# Patient Record
Sex: Male | Born: 2000
Health system: Southern US, Community
[De-identification: ages and names within clinical notes are randomized; demographics above are authoritative.]

## PROBLEM LIST (undated history)

## (undated) DIAGNOSIS — F819 Developmental disorder of scholastic skills, unspecified: Secondary | ICD-10-CM

## (undated) DIAGNOSIS — R6250 Unspecified lack of expected normal physiological development in childhood: Secondary | ICD-10-CM

## (undated) DIAGNOSIS — F840 Autistic disorder: Secondary | ICD-10-CM

## (undated) HISTORY — DX: Unspecified lack of expected normal physiological development in childhood: R62.50

## (undated) HISTORY — PX: CIRCUMCISION: SUR203

## (undated) HISTORY — PX: UPPER GASTROINTESTINAL ENDOSCOPY: SHX188

## (undated) HISTORY — DX: Developmental disorder of scholastic skills, unspecified: F81.9

## (undated) HISTORY — PX: COLONOSCOPY: SHX174

## (undated) HISTORY — DX: Autistic disorder: F84.0

## (undated) HISTORY — PX: DENTAL SURGERY: SHX609

---
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
## 2021-01-30 ENCOUNTER — Telehealth: Payer: Self-pay | Admitting: Family Medicine

## 2021-01-30 NOTE — Telephone Encounter (Signed)
Patient's mom dropped off form to be completed in your folder.

## 2021-02-09 NOTE — Telephone Encounter (Signed)
I filled out the form to the best I know possible.  It is now ready thank you if family needs something additional let me know thank you

## 2021-02-10 DIAGNOSIS — Z029 Encounter for administrative examinations, unspecified: Secondary | ICD-10-CM

## 2021-06-06 ENCOUNTER — Other Ambulatory Visit (INDEPENDENT_AMBULATORY_CARE_PROVIDER_SITE_OTHER): Payer: Self-pay | Admitting: Family

## 2021-06-06 DIAGNOSIS — G4700 Insomnia, unspecified: Secondary | ICD-10-CM

## 2021-07-14 ENCOUNTER — Encounter: Payer: Self-pay | Admitting: Family Medicine

## 2021-07-14 ENCOUNTER — Ambulatory Visit (INDEPENDENT_AMBULATORY_CARE_PROVIDER_SITE_OTHER): Payer: 59 | Admitting: Family Medicine

## 2021-07-14 VITALS — BP 118/72 | HR 63 | Temp 98.1°F | Wt 141.6 lb

## 2021-07-14 DIAGNOSIS — J029 Acute pharyngitis, unspecified: Secondary | ICD-10-CM | POA: Diagnosis not present

## 2021-07-14 LAB — POCT RAPID STREP A (OFFICE): Rapid Strep A Screen: POSITIVE — AB

## 2021-07-14 MED ORDER — AZITHROMYCIN 250 MG PO TABS: ORAL_TABLET | ORAL | 0 refills | Status: AC

## 2021-07-14 NOTE — Progress Notes (Signed)
? ?  Subjective:  ? ? Patient ID: Jon Adams, male    DOB: 01/17/2001, 21 y.o.   MRN: [id number] ? ?HPI ?Pt having sore throat. Red mostly on right side. Pt nephew has been diagnosed with strep. Symptoms began on Sunday.  ?Ongoing sore throat hurts to swallow denies coughing wheezing difficulty breathing had family members who had strep ? ?Review of Systems ? ?   ?Objective:  ? Physical Exam ? ?Gen-NAD not toxic ?TMS-normal bilateral ?T- normal mild redness ?Chest-CTA respiratory rate normal no crackles ?CV RRR no murmur ?Skin-warm dry ?Neuro-grossly normal ?Throat erythematous mainly on the right side ?Exudate noted ? ?   ?Assessment & Plan:  ?Go ahead with treatment with azithromycin to treat for strep ?Await findings of the culture ?Supportive measures discussed ? ?Rapid strep test came back positive ?Should respond to azithromycin ?Follow-up if any problems ?

## 2021-07-17 LAB — CULTURE, GROUP A STREP: Strep A Culture: NEGATIVE

## 2021-07-19 ENCOUNTER — Telehealth: Payer: 59 | Admitting: Physician Assistant

## 2021-07-19 DIAGNOSIS — J020 Streptococcal pharyngitis: Secondary | ICD-10-CM

## 2021-07-19 MED ORDER — AMOXICILLIN-POT CLAVULANATE 400-57 MG/5ML PO SUSR: 875.0000 mg | Freq: Two times a day (BID) | ORAL | 0 refills | Status: AC

## 2021-07-19 NOTE — Progress Notes (Signed)
Virtual Visit Consent   [employer], you are scheduled for a virtual visit with a [facility] provider today. Just as with appointments in the office, your consent must be obtained to participate. Your consent will be active for this visit and any virtual visit you may have with one of our providers in the next 365 days. If you have a MyChart account, a copy of this consent can be sent to you electronically.  As this is a virtual visit, video technology does not allow for your provider to perform a traditional examination. This may limit your provider's ability to fully assess your condition. If your provider identifies any concerns that need to be evaluated in person or the need to arrange testing (such as labs, EKG, etc.), we will make arrangements to do so. Although advances in technology are sophisticated, we cannot ensure that it will always work on either your end or our end. If the connection with a video visit is poor, the visit may have to be switched to a telephone visit. With either a video or telephone visit, we are not always able to ensure that we have a secure connection.  By engaging in this virtual visit, you consent to the provision of healthcare and authorize for your insurance to be billed (if applicable) for the services provided during this visit. Depending on your insurance coverage, you may receive a charge related to this service.  I need to obtain your verbal consent now. Are you willing to proceed with your visit today? [employer] has provided verbal consent on 07/19/2021 for a virtual visit (video or telephone). Inda Coke, [place]  Date: 07/19/2021 12:40 PM  Virtual Visit via Video Note   I, Inda Coke, connected with  Jon Adams  ([id number], [birth year]) on 07/19/21 at  1:00 PM EDT by a video-enabled telemedicine application and verified that I am speaking with the correct person using two identifiers.  Location: Patient: Virtual Visit Location Patient:  Home Provider: Virtual Visit Location Provider: Home Office   I discussed the limitations of evaluation and management by telemedicine and the availability of in person appointments. The patient expressed understanding and agreed to proceed.    History of Present Illness: Jon Adams is a 21 y.o. who identifies as a male who was assigned male at birth, and is being seen today for sore throat. Mother, [name], is on the call with patient today.  Patient was seen by his PCP on 07/14/21 for sore throat. His strep test was positive and he was given oral azithromycin. His nephew recently had strep and exposed him. He has been taking abx as prescribed, however he is having ongoing pain. While he initially had pain on the R side of throat, he now has pain b/l. Also now has cough with green mucus. Mom is also coughing now and on an abx.  He is taking 2 x 200 mg ibuprofen q 6 hours, dimetapp cold and allergy q 4-6 hours.  Denies: SOB, hx of asthma, chest pain, fevers, chills, inability to swallow   Problems:  Patient Active Problem List   Diagnosis Date Noted   Heartburn 01/29/2016   Low weight 10/22/2014   Dysphagia, oral phase 08/27/2014   Anxiety 08/27/2014   Picky eater 03/28/2014   Insomnia 10/24/2013   Laxity of ligament 10/24/2013   Atopic rhinitis 05/31/2012   Autistic disorder 05/31/2012    Allergies:  Allergies  Allergen Reactions   Other     Cat dander-  eyes watery and swollen; runny nose   Medications:  Current Outpatient Medications:    azithromycin (ZITHROMAX) 250 MG tablet, Take 2 tablets on day 1, then 1 tablet daily on days 2 through 5, Disp: 6 tablet, Rfl: 0   cloNIDine (CATAPRES) 0.1 MG tablet, TAKE (1) TABLET BY MOUTH AT BEDTIME., Disp: 30 tablet, Rfl: 5   polyethylene glycol (MIRALAX / GLYCOLAX) packet, Take 17 g by mouth as needed. , Disp: , Rfl:   Observations/Objective: Patient is well-developed, well-nourished in no acute distress.  Resting comfortably  at  home.  Head is normocephalic, atraumatic.  No labored breathing.  Speech is clear and coherent with logical content.  Patient is alert and oriented at baseline.   Assessment and Plan: 1. Strep pharyngitis Ongoing No improvement of sx and now with cough Will treat for possible sinusitis with oral augmentin -- patient prefers liquid Recommend OTC probiotic Push fluids Continue ibuprofen q 6 hours as already doing If worsening or lack of improvement, recommend in office evaluation  Follow Up Instructions: I discussed the assessment and treatment plan with the patient. The patient was provided an opportunity to ask questions and all were answered. The patient agreed with the plan and demonstrated an understanding of the instructions.  A copy of instructions were sent to the patient via MyChart unless otherwise noted below.   The patient was advised to call back or seek an in-person evaluation if the symptoms worsen or if the condition fails to improve as anticipated.  Time:  I spent 15 minutes with the patient via telehealth technology discussing the above problems/concerns.    Inda Coke, [place]

## 2021-08-01 ENCOUNTER — Ambulatory Visit (HOSPITAL_COMMUNITY)
Admission: RE | Admit: 2021-08-01 | Discharge: 2021-08-01 | Disposition: A | Discharge status: Home / Self Care | Payer: 59 | Source: Ambulatory Visit | Attending: Family Medicine | Admitting: Family Medicine

## 2021-08-01 ENCOUNTER — Encounter: Payer: Self-pay | Admitting: Family Medicine

## 2021-08-01 ENCOUNTER — Ambulatory Visit (INDEPENDENT_AMBULATORY_CARE_PROVIDER_SITE_OTHER): Payer: 59 | Admitting: Family Medicine

## 2021-08-01 VITALS — BP 126/80 | HR 75 | Temp 98.3°F | Wt 144.6 lb

## 2021-08-01 DIAGNOSIS — R0781 Pleurodynia: Secondary | ICD-10-CM | POA: Diagnosis not present

## 2021-08-01 DIAGNOSIS — R059 Cough, unspecified: Secondary | ICD-10-CM

## 2021-08-01 MED ORDER — MELOXICAM 15 MG PO TABS: 15.0000 mg | ORAL_TABLET | Freq: Every day | ORAL | 0 refills | Status: DC | PRN

## 2021-08-01 NOTE — Assessment & Plan Note (Signed)
Chest xray obtained today and independently reviewed by me. Interpretation: Normal xray. No evidence of infiltrate.  Likely MSK in nature. Mobic as prescribed.

## 2021-08-01 NOTE — Patient Instructions (Signed)
Chest x-ray today.  We will call with the results.  Medication as directed.  If you fail to improve or worsen, please let me know.

## 2021-08-01 NOTE — Progress Notes (Signed)
Subjective:  Patient ID: Jon Adams, male    DOB: 05-20-2000  Age: 21 y.o. MRN: [id number]  CC: Chief Complaint  Patient presents with   left side pain    Left side pain since last week. Began on Wednesday the 24th. Area is tender. Radiates toward abdomen. Pt has tried Advil. No n/v. Pt also did teleheath for cough and was prescribed Augmentin and then the pain began   HPI:  21 year old male presents for evaluation of the above.  He reports left lower rib pain since last week (5/24). Preceded by strep. Was treated and subsequently developed cough. Pain is intermittent. Worse with certain movements. No chest pain, SOB. Does still have a cough. No fever.   Patient Active Problem List   Diagnosis Date Noted   Rib pain on left side 08/01/2021   Heartburn 01/29/2016   Anxiety 08/27/2014   Insomnia 10/24/2013   Atopic rhinitis 05/31/2012   Autistic disorder 05/31/2012    Social Hx   Social History   Socioeconomic History   Marital status: Single    Spouse name: Not on file   Number of children: Not on file   Years of education: Not on file   Highest education level: Not on file  Occupational History   Not on file  Tobacco Use   Smoking status: Never   Smokeless tobacco: Never  Substance and Sexual Activity   Alcohol use: No    Alcohol/week: 0.0 standard drinks   Drug use: No   Sexual activity: Never  Other Topics Concern   Not on file  Social History Narrative   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    Jon Adams enjoys Chief Technology Officer, old money, computers, and playing on his phone.    He lives with his parents and siblings.   Social Determinants of Health   Financial Resource Strain: Not on file  Food Insecurity: Not on file  Transportation Needs: Not on file  Physical Activity: Not on file  Stress: Not on file  Social Connections: Not on file    Review of Systems Per HPI  Objective:  BP 126/80   Pulse 75   Temp 98.3 F (36.8 C)   Wt 144 lb 9.6 oz (65.6 kg)    SpO2 96%   BMI 21.05 kg/m      08/01/2021    1:30 PM 07/14/2021   11:35 AM 11/07/2020   11:09 AM  BP/Weight  Systolic BP 341 937 902  Diastolic BP 80 72 56  Wt. (Lbs) 144.6 141.6 149  BMI 21.05 kg/m2 20.61 kg/m2 21.69 kg/m2    Physical Exam Vitals and nursing note reviewed.  Constitutional:      General: He is not in acute distress.    Appearance: Normal appearance. He is not ill-appearing.  HENT:     Head: Normocephalic and atraumatic.  Cardiovascular:     Rate and Rhythm: Normal rate and regular rhythm.  Pulmonary:     Effort: Pulmonary effort is normal.     Breath sounds: Normal breath sounds. No wheezing, rhonchi or rales.  Abdominal:     General: There is no distension.     Palpations: Abdomen is soft.     Tenderness: There is no abdominal tenderness.  Neurological:     Mental Status: He is alert.    Lab Results  Component Value Date   WBC 4.7 05/15/2014   HGB 16.2 05/15/2014   HCT 47.5 05/15/2014   PLT 266 05/15/2014  GLUCOSE 94 05/15/2014   ALT 7 06/08/2014   AST 13 06/08/2014   NA 143 05/15/2014   K 4.7 05/15/2014   CL 101 05/15/2014   CREATININE 0.90 05/15/2014   BUN 7 05/15/2014   CO2 24 05/15/2014   TSH 1.890 06/08/2014     Assessment & Plan:   Problem List Items Addressed This Visit       Other   Rib pain on left side - Primary    Chest xray obtained today and independently reviewed by me. Interpretation: Normal xray. No evidence of infiltrate.  Likely MSK in nature. Mobic as prescribed.        Relevant Orders   DG Chest 2 View (Completed)   Other Visit Diagnoses     Cough, unspecified type       Relevant Orders   DG Chest 2 View (Completed)       Meds ordered this encounter  Medications   meloxicam (MOBIC) 15 MG tablet    Sig: Take 1 tablet (15 mg total) by mouth daily as needed for pain.    Dispense:  30 tablet    Refill:  [facility]

## 2021-08-15 ENCOUNTER — Telehealth: Payer: 59 | Admitting: Physician Assistant

## 2021-08-15 DIAGNOSIS — B9689 Other specified bacterial agents as the cause of diseases classified elsewhere: Secondary | ICD-10-CM | POA: Diagnosis not present

## 2021-08-15 DIAGNOSIS — J019 Acute sinusitis, unspecified: Secondary | ICD-10-CM | POA: Diagnosis not present

## 2021-08-15 MED ORDER — AMOXICILLIN-POT CLAVULANATE 400-57 MG/5ML PO SUSR: 10.0000 mL | Freq: Two times a day (BID) | ORAL | 0 refills | Status: DC

## 2021-08-15 NOTE — Progress Notes (Signed)
E-Visit for Sinus Problems  We are sorry that you are not feeling well.  Here is how we plan to help!  Based on what you have shared with me it looks like you have sinusitis.  Sinusitis is inflammation and infection in the sinus cavities of the head.  Based on your presentation I believe you most likely have Acute Bacterial Sinusitis.  This is an infection caused by bacteria and is treated with antibiotics. I have prescribed Augmentin '875mg'$ /'125mg'$  one tablet twice daily with food, for 7 days. Liquid medication prescribed. You may use an oral decongestant such as Mucinex D or if you have glaucoma or high blood pressure use plain Mucinex. Saline nasal spray help and can safely be used as often as needed for congestion.  If you develop worsening sinus pain, fever or notice severe headache and vision changes, or if symptoms are not better after completion of antibiotic, please schedule an appointment with a health care provider.    Sinus infections are not as easily transmitted as other respiratory infection, however we still recommend that you avoid close contact with loved ones, especially the very young and elderly.  Remember to wash your hands thoroughly throughout the day as this is the number one way to prevent the spread of infection!  Home Care: Only take medications as instructed by your medical team. Complete the entire course of an antibiotic. Do not take these medications with alcohol. A steam or ultrasonic humidifier can help congestion.  You can place a towel over your head and breathe in the steam from hot water coming from a faucet. Avoid close contacts especially the very young and the elderly. Cover your mouth when you cough or sneeze. Always remember to wash your hands.  Get Help Right Away If: You develop worsening fever or sinus pain. You develop a severe head ache or visual changes. Your symptoms persist after you have completed your treatment plan.  Make sure you Understand  these instructions. Will watch your condition. Will get help right away if you are not doing well or get worse.  Thank you for choosing an e-visit.  Your e-visit answers were reviewed by a board certified advanced clinical practitioner to complete your personal care plan. Depending upon the condition, your plan could have included both over the counter or prescription medications.  Please review your pharmacy choice. Make sure the pharmacy is open so you can pick up prescription now. If there is a problem, you may contact your provider through [employer] and have the prescription routed to another pharmacy.  Your safety is important to [place]. If you have drug allergies check your prescription carefully.   For the next 24 hours you can use MyChart to ask questions about today's visit, request a non-urgent call back, or ask for a work or school excuse. You will get an email in the next two days asking about your experience. I hope that your e-visit has been valuable and will speed your recovery.   I provided 5 minutes of non face-to-face time during this encounter for chart review and documentation.

## 2021-08-18 ENCOUNTER — Ambulatory Visit
Admission: RE | Admit: 2021-08-18 | Discharge: 2021-08-18 | Disposition: A | Discharge status: Home / Self Care | Payer: 59 | Source: Ambulatory Visit | Attending: Nurse Practitioner | Admitting: Nurse Practitioner

## 2021-08-18 VITALS — BP 134/86 | HR 81 | Temp 98.0°F | Resp 20

## 2021-08-18 DIAGNOSIS — R0781 Pleurodynia: Secondary | ICD-10-CM | POA: Diagnosis not present

## 2021-08-18 LAB — POCT URINALYSIS DIP (MANUAL ENTRY)
Bilirubin, UA: NEGATIVE
Glucose, UA: NEGATIVE mg/dL
Ketones, POC UA: NEGATIVE mg/dL
Leukocytes, UA: NEGATIVE
Nitrite, UA: NEGATIVE
Protein Ur, POC: NEGATIVE mg/dL
Spec Grav, UA: >=1.03 — AB (ref 1.010–1.025)
Urobilinogen, UA: 0.2 E.U./dL
pH, UA: 5.5 (ref 5.0–8.0)

## 2021-08-18 MED ORDER — TIZANIDINE HCL 2 MG PO TABS: 4.0000 mg | ORAL_TABLET | Freq: Every evening | ORAL | 0 refills | Status: DC | PRN

## 2021-08-18 MED ORDER — LIDOCAINE 5 % EX PTCH: 1.0000 | MEDICATED_PATCH | CUTANEOUS | 0 refills | Status: DC

## 2021-08-18 NOTE — ED Provider Notes (Signed)
[facility]    CSN: [id number] Arrival date & time: 08/18/21  1159      History   Chief Complaint Chief Complaint  Patient presents with   Back Pain    Entered by patient    HPI Jon Adams is a 21 y.o. male.   Patient presents with mother for a few hours of left sided rib pain.  Reports he has been coughing on and off for the past few weeks.  Coughed this morning and immediately had pain in his left posterior rib cage. About 2 weeks ago, had similar incident and chest x-ray was negative.   Reports over the past few weeks, he has been treated for strep throat, and a sinus infection.  He stopped taking the Augmentin because it made him have GI upset.  He denies fevers, chills, body aches, pain with inspiration, chest pain, shortness of breath.  Reports the pain in his back/rib cage is worse with certain movements, when sitting down, coughing.  Has taken meloxicam for the pain without relief.      Past Medical History:  Diagnosis Date   Autism    Development delay    Learning disabilities     Patient Active Problem List   Diagnosis Date Noted   Rib pain on left side 08/01/2021   Heartburn 01/29/2016   Anxiety 08/27/2014   Insomnia 10/24/2013   Atopic rhinitis 05/31/2012   Autistic disorder 05/31/2012    Past Surgical History:  Procedure Laterality Date   CIRCUMCISION  2003   COLONOSCOPY     DENTAL SURGERY     UPPER GASTROINTESTINAL ENDOSCOPY         Home Medications    Prior to Admission medications   Medication Sig Start Date End Date Taking? Authorizing Provider  lidocaine (LIDODERM) 5 % Place 1 patch onto the skin daily. Remove & Discard patch within 12 hours or as directed by MD 08/18/21  Yes Eulogio Bear, NP  tiZANidine (ZANAFLEX) 2 MG tablet Take 2 tablets (4 mg total) by mouth at bedtime as needed for muscle spasms. 08/18/21  Yes Noemi Chapel A, NP  amoxicillin-clavulanate (AUGMENTIN) 400-57 MG/5ML suspension Take 10 mLs by  mouth 2 (two) times daily. 08/15/21   Mar Daring, PA-C  cloNIDine (CATAPRES) 0.1 MG tablet TAKE (1) TABLET BY MOUTH AT BEDTIME. 06/06/21   Rockwell Germany, NP  meloxicam (MOBIC) 15 MG tablet Take 1 tablet (15 mg total) by mouth daily as needed for pain. 08/01/21   Coral Spikes, DO  polyethylene glycol (MIRALAX / GLYCOLAX) packet Take 17 g by mouth as needed.     [provider]    Family History Family History  Problem Relation Age of Onset   Asthma Sister    Anxiety disorder Sister    Diabetes Maternal Grandfather    Hypertension Maternal Grandfather    Hyperlipidemia Maternal Grandfather    Diabetes Paternal Grandmother    Hypertension Paternal Grandmother    Hyperlipidemia Paternal Grandmother    Heart attack Paternal Grandfather        Died at 80   Anxiety disorder Mother    Depression Mother     Social History Social History   Tobacco Use   Smoking status: Never   Smokeless tobacco: Never  Substance Use Topics   Alcohol use: No    Alcohol/week: 0.0 standard drinks of alcohol   Drug use: No     Allergies   Other   Review of Systems Review  of Systems Per HPI  Physical Exam Triage Vital Signs ED Triage Vitals  Enc Vitals Group     BP 08/18/21 1208 134/86     Pulse Rate 08/18/21 1208 81     Resp 08/18/21 1208 20     Temp 08/18/21 1208 98 F (36.7 C)     Temp src --      SpO2 08/18/21 1208 97 %     Weight --      Height --      Head Circumference --      Peak Flow --      Pain Score 08/18/21 1206 8     Pain Loc --      Pain Edu? --      Excl. in [place]? --    No data found.  Updated Vital Signs BP 134/86   Pulse 81   Temp 98 F (36.7 C)   Resp 20   SpO2 97%   Visual Acuity Right Eye Distance:   Left Eye Distance:   Bilateral Distance:    Right Eye Near:   Left Eye Near:    Bilateral Near:     Physical Exam Vitals and nursing note reviewed.  Constitutional:      General: He is not in acute distress.    Appearance:  Normal appearance. He is not ill-appearing or toxic-appearing.  HENT:     Head: Normocephalic and atraumatic.     Nose: Nose normal. No congestion.     Mouth/Throat:     Mouth: Mucous membranes are moist.     Pharynx: Oropharynx is clear.  Cardiovascular:     Rate and Rhythm: Normal rate and regular rhythm.  Pulmonary:     Effort: Pulmonary effort is normal. No respiratory distress.     Breath sounds: Normal breath sounds. No wheezing, rhonchi or rales.  Musculoskeletal:     Cervical back: Normal range of motion.  Lymphadenopathy:     Cervical: No cervical adenopathy.  Skin:    General: Skin is warm and dry.     Capillary Refill: Capillary refill takes less than 2 seconds.     Coloration: Skin is not jaundiced or pale.     Findings: No erythema.  Neurological:     Mental Status: He is alert and oriented to person, place, and time.  Psychiatric:        Behavior: Behavior is cooperative.      UC Treatments / Results  Labs (all labs ordered are listed, but only abnormal results are displayed) Labs Reviewed  POCT URINALYSIS DIP (MANUAL ENTRY) - Abnormal; Notable for the following components:      Result Value   Spec Grav, UA >=1.030 (*)    Blood, UA trace-intact (*)    All other components within normal limits    EKG   Radiology No results found.  Procedures Procedures (including critical care time)  Medications Ordered in UC Medications - No data to display  Initial Impression / Assessment and Plan / UC Course  I have reviewed the triage vital signs and the nursing notes.  Pertinent labs & imaging results that were available during my care of the patient were reviewed by me and considered in my medical decision making (see chart for details).    Very pleasant, well-appearing 21 year old male with left-sided posterior rib cage pain that started this morning.  I suspect costochondritis.  Urinalysis shows elevated specific gravity, likely secondary to slight  dehydration.  Encourage increasing water intake.  Treat  costochondritis with Tylenol 500 mg alternating with meloxicam as needed.  Can also use lidocaine patches and tizanidine 2 mg at nighttime as needed for muscle pain.  Follow-up with primary care provider if symptoms persist or worsen despite treatment.  Final Clinical Impressions(s) / UC Diagnoses   Final diagnoses:  Rib pain on left side     Discharge Instructions      - The rib pain is likely from inflammation in your rib cage - You can use Tylenol 500 mg every 6 hours as needed for the pain - Can also use Lidocaine patches and tizanidine 2 mg at night time as needed.   - Follow up with your primary care provider if the pain persists longer than 6 weeks without improvement    ED Prescriptions     Medication Sig Dispense Auth. Provider   lidocaine (LIDODERM) 5 % Place 1 patch onto the skin daily. Remove & Discard patch within 12 hours or as directed by MD 30 patch Eulogio Bear, NP   tiZANidine (ZANAFLEX) 2 MG tablet Take 2 tablets (4 mg total) by mouth at bedtime as needed for muscle spasms. 30 tablet Eulogio Bear, NP      PDMP not reviewed this encounter.   Eulogio Bear, NP 08/18/21 1311

## 2021-08-18 NOTE — Discharge Instructions (Addendum)
-   The rib pain is likely from inflammation in your rib cage - You can use Tylenol 500 mg every 6 hours as needed for the pain - Can also use Lidocaine patches and tizanidine 2 mg at night time as needed.   - Follow up with your primary care provider if the pain persists longer than 6 weeks without improvement

## 2021-08-18 NOTE — ED Triage Notes (Signed)
Pt presents with c/o left flank pain that began suddenly today

## 2021-12-06 ENCOUNTER — Other Ambulatory Visit (INDEPENDENT_AMBULATORY_CARE_PROVIDER_SITE_OTHER): Payer: Self-pay | Admitting: Family

## 2021-12-06 DIAGNOSIS — G4700 Insomnia, unspecified: Secondary | ICD-10-CM

## 2021-12-08 NOTE — Telephone Encounter (Signed)
Seen 11/07/20 requested front office call and sched follow up OV

## 2021-12-28 NOTE — Progress Notes (Signed)
JAKEOB TULLIS   MRN:  [id number]  07/18/2000   Provider: Rockwell Germany NP-C Location of Care: [facility]  Visit type: Return visit  Last visit: 11/07/2020  Referral source: Kathyrn Drown, MD  History from: Epic chart, patient and his mother  Brief history:  Copied from previous record: History of autism, ligamentous laxity and insomnia. Clonidine has worked well for helping him to get to sleep at night. He also has problems with appetite and sensory issues with food, and has received feeding therapy. He has graduated from his home school program.   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 He spends his days with his mother at her work and helps with simple chores at home. Able's diet has improved and he sleeps fairly well for the most part. There are no problems with behavior.   Zamire has been otherwise generally healthy since he was last seen. Neither he nor his mother have other health concerns for him today other than previously mentioned.  Review of systems: Please see HPI for neurologic and other pertinent review of systems. Otherwise all other systems were reviewed and were negative.  Problem List: Patient Active Problem List   Diagnosis Date Noted   Rib pain on left side 08/01/2021   Heartburn 01/29/2016   Anxiety 08/27/2014   Insomnia 10/24/2013   Atopic rhinitis 05/31/2012   Autistic disorder 05/31/2012     Past Medical History:  Diagnosis Date   Autism    Development delay    Learning disabilities     Past medical history comments: See HPI  Surgical history: Past Surgical History:  Procedure Laterality Date   CIRCUMCISION  11-28-2001   COLONOSCOPY     DENTAL SURGERY     UPPER GASTROINTESTINAL ENDOSCOPY      Family history: family history includes Anxiety disorder in his mother and sister; Asthma in his sister; Depression in his mother; Diabetes in his maternal grandfather and paternal grandmother;  Heart attack in his paternal grandfather; Hyperlipidemia in his maternal grandfather and paternal grandmother; Hypertension in his maternal grandfather and paternal grandmother.   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    Carliss enjoys Chief Technology Officer, old money, computers, and playing on his phone.    He lives with his parents and siblings.   Social Determinants of Health   Financial Resource Strain: Not on file  Food Insecurity: Not on file  Transportation Needs: Not on file  Physical Activity: Not on file  Stress: Not on file  Social Connections: Not on file  Intimate Partner Violence: Not on file    Past/failed meds:  Allergies: Allergies  Allergen Reactions   Other     Cat dander- eyes watery and swollen; runny nose      Immunizations: Immunization History  Administered Date(s) Administered  DTaP 06/10/2001, 08/15/2001, 10/17/2001, 10/09/2002, 04/30/2005   HIB (PRP-OMP) 06/10/2001, 08/15/2001, 10/17/2001, 04/10/2002   Hepatitis A 05/07/2011, 06/07/2012   Hepatitis B 04-29-2001, 06/10/2001, 10/17/2001   IPV 06/10/2001, 08/15/2001, 10/09/2002, 04/30/2005   Influenza Split 12/30/2012   Influenza, Seasonal, Injecte, Preservative Fre 11/15/2015   Influenza,inj,Quad PF,6+ Mos 11/30/2013, 11/14/2014, 12/25/2016   Influenza-Unspecified 10/17/2001, 04/10/2002, 01/05/2007, 02/09/2007, 12/14/2007, 02/13/2009, 12/31/2009, 01/08/2012, 12/01/2018, 12/28/2020   MMR  04/10/2002, 04/30/2005   Meningococcal Conjugate 10/25/2012, 05/11/2017   Pneumococcal Conjugate-13 06/10/2001, 08/15/2001   Tdap 10/25/2012   Varicella 04/10/2002, 05/07/2011    Diagnostics/Screenings:  Physical Exam: BP (!) 130/90 (BP Location: Left Arm, Patient Position: Sitting, Cuff Size: Normal)   Pulse 84   Ht 5' 9.29" (1.76 m)   Wt 148 lb 3.2 oz (67.2 kg)   BMI 21.70 kg/m   General: Well developed, well nourished young man, seated on exam table, in no evident distress Head: Head normocephalic and atraumatic.  Oropharynx benign. Neck: Supple Cardiovascular: Regular rate and rhythm, no murmurs Respiratory: Breath sounds clear to auscultation Musculoskeletal: No obvious deformities or scoliosis Skin: No rashes or neurocutaneous lesions  Neurologic Exam Mental Status: Awake and fully alert.  Oriented to place and time. Fund of knowledge subnormal for age.  Mood and affect appropriate. Cranial Nerves: Fundoscopic exam reveals sharp disc margins.  Pupils equal, briskly reactive to light.  Extraocular movements full without nystagmus. Hearing intact and symmetric to whisper.  Facial sensation intact.  Face tongue, palate move normally and symmetrically. Shoulder shrug normal Motor: Normal bulk and tone. Normal strength in all tested extremity muscles. Sensory: Intact to touch and temperature in all extremities.  Coordination: Rapid alternating movements normal in all extremities.  Finger-to-nose and heel-to shin performed accurately bilaterally.  Romberg negative. Gait and Station: Arises from chair without difficulty.  Stance is normal. Gait demonstrates normal stride length and balance.   Able to heel, toe and tandem walk without difficulty. Reflexes: 1+ and symmetric. Toes downgoing.   Impression: Autism  Insomnia, unspecified type - Plan: cloNIDine (CATAPRES) 0.1 MG tablet   Recommendations for plan of care: The patient's previous Epic records were reviewed. Faruq has  neither had nor required imaging or lab studies since the last visit. He is doing well at this time and I will make no changes in his treatment plan. I will see Cowen back in follow up in 1 year or sooner if needed. Mom agreed with the plans made today.   The medication list was reviewed and reconciled. No changes were made in the prescribed medications today. A complete medication list was provided to the patient.  Return in about 1 year (around 12/30/2022).   Allergies as of 12/29/2021       Reactions   Other    Cat dander- eyes watery and swollen; runny nose        Medication List        Accurate as of December 29, 2021 11:59 PM. If you have any questions, ask your nurse or doctor.          STOP taking these medications    amoxicillin-clavulanate 400-57 MG/5ML suspension Commonly known as: AUGMENTIN Stopped by: Rockwell Germany, NP   lidocaine 5 % Commonly known as: Lidoderm Stopped by: Rockwell Germany, NP   meloxicam 15 MG tablet Commonly known as: MOBIC Stopped by: Rockwell Germany, NP   polyethylene glycol 17 g packet Commonly known as: MIRALAX / GLYCOLAX Stopped by: Rockwell Germany, NP   tiZANidine 2 MG tablet Commonly known  as: ZANAFLEX Stopped by: Rockwell Germany, NP       TAKE these medications    cloNIDine 0.1 MG tablet Commonly known as: CATAPRES TAKE (1) TABLET BY MOUTH AT BEDTIME.      Total time spent with the patient was 20 minutes, of which 50% or more was spent in counseling and coordination of care.  Rockwell Germany NP-C [facility] Ph. [phone] Fax [phone]

## 2021-12-29 ENCOUNTER — Ambulatory Visit (INDEPENDENT_AMBULATORY_CARE_PROVIDER_SITE_OTHER): Payer: 59 | Admitting: Family

## 2021-12-29 ENCOUNTER — Encounter (INDEPENDENT_AMBULATORY_CARE_PROVIDER_SITE_OTHER): Payer: Self-pay | Admitting: Family

## 2021-12-29 VITALS — BP 130/90 | HR 84 | Ht 69.29 in | Wt 148.2 lb

## 2021-12-29 DIAGNOSIS — F840 Autistic disorder: Secondary | ICD-10-CM

## 2021-12-29 DIAGNOSIS — G4700 Insomnia, unspecified: Secondary | ICD-10-CM

## 2021-12-29 MED ORDER — CLONIDINE HCL 0.1 MG PO TABS: ORAL_TABLET | ORAL | 5 refills | Status: DC

## 2021-12-29 NOTE — Patient Instructions (Addendum)
It was a pleasure to see you today!  Instructions for you until your next appointment are as follows: Continue your medications as prescribed Let me know if you have any questions or concerns. Please sign up for MyChart if you have not done so. Please plan to return for follow up in one year or sooner if needed.  Feel free to contact our office during normal business hours at [phone] with questions or concerns. If there is no answer or the call is outside business hours, please leave a message and our clinic staff will call you back within the next business day.  If you have an urgent concern, please stay on the line for our after-hours answering service and ask for the on-call neurologist.     I also encourage you to use MyChart to communicate with me more directly. If you have not yet signed up for MyChart within [facility], the front desk staff can help you. However, please note that this inbox is NOT monitored on nights or weekends, and response can take up to 2 business days.  Urgent matters should be discussed with the on-call pediatric neurologist.   At [facility], we are committed to providing exceptional care. You will receive a patient satisfaction survey through text or email regarding your visit today. Your opinion is important to me. Comments are appreciated.

## 2022-01-04 ENCOUNTER — Encounter (INDEPENDENT_AMBULATORY_CARE_PROVIDER_SITE_OTHER): Payer: Self-pay | Admitting: Family

## 2022-02-27 ENCOUNTER — Encounter: Payer: Self-pay | Admitting: Family Medicine

## 2022-02-27 ENCOUNTER — Ambulatory Visit (INDEPENDENT_AMBULATORY_CARE_PROVIDER_SITE_OTHER): Payer: 59 | Admitting: Family Medicine

## 2022-02-27 DIAGNOSIS — R197 Diarrhea, unspecified: Secondary | ICD-10-CM | POA: Insufficient documentation

## 2022-02-27 HISTORY — DX: Diarrhea, unspecified: R19.7

## 2022-02-27 NOTE — Patient Instructions (Signed)
Lots of fluids.   Normal Diet.  Stool sample.  Take care  Dr. Lacinda Axon

## 2022-02-27 NOTE — Assessment & Plan Note (Signed)
Given duration and lack of improvement, arranging for stool testing.  Treatment and recommendations will be given following results of the test.  I advised no dietary restrictions at this time.  Lots of fluids.  Advised against antidiarrheals currently.

## 2022-02-27 NOTE — Progress Notes (Signed)
Subjective:  Patient ID: Jon Adams, male    DOB: [birth year]  Age: 21 y.o. MRN: [id number]  CC: Diarrhea  HPI:  21 year old male presents with his mother for evaluation of diarrhea.  Started on Christmas Eve.  He has not eaten anything out of the norm.  He has had ongoing diarrhea.  He has had some lower abdominal discomfort.  He is also had some belching.  No nausea or vomiting.  Has taken Imodium a few times without resolution.  No fever.  No other associated symptoms.  Patient Active Problem List   Diagnosis Date Noted   Diarrhea of presumed infectious origin 02/27/2022   Heartburn 01/29/2016   Anxiety 08/27/2014   Insomnia 10/24/2013   Atopic rhinitis 05/31/2012   Autism 05/31/2012    Social Hx   Social History   Socioeconomic History   Marital status: Single    Spouse name: Not on file   Number of children: Not on file   Years of education: Not on file   Highest education level: Not on file  Occupational History   Not on file  Tobacco Use   Smoking status: Never   Smokeless tobacco: Never  Substance and Sexual Activity   Alcohol use: No    Alcohol/week: 0.0 standard drinks of alcohol   Drug use: No   Sexual activity: Never  Other Topics Concern   Not on file  Social History Narrative   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    Jon Adams enjoys Chief Technology Officer, old money, computers, and playing on his phone.    He lives with his parents and siblings.   Social Determinants of Health   Financial Resource Strain: Not on file  Food Insecurity: Not on file  Transportation Needs: Not on file  Physical Activity: Not on file  Stress: Not on file  Social Connections: Not on file    Review of Systems Per HPI  Objective:  BP 114/79   Pulse 91   Temp 98.2 F (36.8 C)   Ht 5' 9.29" (1.76 m)   Wt 147 lb (66.7 kg)   SpO2 96%   BMI 21.53 kg/m      02/27/2022    9:57 AM 12/29/2021   11:04 AM 08/18/2021   12:08 PM  BP/Weight  Systolic BP 938 101 751   Diastolic BP 79 90 86  Wt. (Lbs) 147 148.2   BMI 21.53 kg/m2 21.7 kg/m2     Physical Exam Vitals and nursing note reviewed.  Constitutional:      General: He is not in acute distress. HENT:     Head: Normocephalic and atraumatic.  Eyes:     General:        Right eye: No discharge.        Left eye: No discharge.     Conjunctiva/sclera: Conjunctivae normal.  Cardiovascular:     Rate and Rhythm: Normal rate and regular rhythm.  Pulmonary:     Effort: Pulmonary effort is normal.     Breath sounds: Normal breath sounds. No wheezing, rhonchi or rales.  Abdominal:     General: There is no distension.     Palpations: Abdomen is soft.     Tenderness: There is no abdominal tenderness.  Neurological:     Mental Status: He is alert.     Lab Results  Component Value Date   WBC 4.7 05/15/2014   HGB 16.2 05/15/2014   HCT 47.5 05/15/2014   PLT 266 05/15/2014   GLUCOSE 94  05/15/2014   ALT 7 06/08/2014   AST 13 06/08/2014   NA 143 05/15/2014   K 4.7 05/15/2014   CL 101 05/15/2014   CREATININE 0.90 05/15/2014   BUN 7 05/15/2014   CO2 24 05/15/2014   TSH 1.890 06/08/2014     Assessment & Plan:   Problem List Items Addressed This Visit       Digestive   Diarrhea of presumed infectious origin    Given duration and lack of improvement, arranging for stool testing.  Treatment and recommendations will be given following results of the test.  I advised no dietary restrictions at this time.  Lots of fluids.  Advised against antidiarrheals currently.      Relevant Orders   GI Profile, Stool, PCR   Follow-up: Pending results  [place]

## 2022-03-02 HISTORY — PX: WISDOM TOOTH EXTRACTION: SHX21

## 2022-03-02 LAB — GI PROFILE, STOOL, PCR

## 2022-06-10 ENCOUNTER — Other Ambulatory Visit (INDEPENDENT_AMBULATORY_CARE_PROVIDER_SITE_OTHER): Payer: Self-pay | Admitting: Family

## 2022-06-10 DIAGNOSIS — G4700 Insomnia, unspecified: Secondary | ICD-10-CM

## 2022-08-10 ENCOUNTER — Ambulatory Visit (INDEPENDENT_AMBULATORY_CARE_PROVIDER_SITE_OTHER): Payer: 59 | Admitting: Family Medicine

## 2022-08-10 VITALS — BP 121/77 | HR 77 | Ht 69.29 in | Wt 135.4 lb

## 2022-08-10 DIAGNOSIS — R17 Unspecified jaundice: Secondary | ICD-10-CM

## 2022-08-10 DIAGNOSIS — E538 Deficiency of other specified B group vitamins: Secondary | ICD-10-CM

## 2022-08-10 DIAGNOSIS — R634 Abnormal weight loss: Secondary | ICD-10-CM | POA: Diagnosis not present

## 2022-08-10 DIAGNOSIS — R7989 Other specified abnormal findings of blood chemistry: Secondary | ICD-10-CM

## 2022-08-10 DIAGNOSIS — R1013 Epigastric pain: Secondary | ICD-10-CM

## 2022-08-10 MED ORDER — PANTOPRAZOLE SODIUM 40 MG PO TBEC: 40.0000 mg | DELAYED_RELEASE_TABLET | Freq: Every day | ORAL | 3 refills | Status: DC

## 2022-08-10 NOTE — Progress Notes (Signed)
   Subjective:    Patient ID: Jon Adams, male    DOB: [birth year], 22 y.o.   MRN: [id number]  HPI Patient arrives today having stomach issues. Patient has had some weight loss.  Relates at times when he eats he feels a nauseous feeling and unsettled feeling in his epigastrium denies any major setbacks denies vomiting denies dysphagia no wheezing or difficulty breathing no fevers chills vomiting or diarrhea    Review of Systems     Objective:   Physical Exam  General-in no acute distress Eyes-no discharge Lungs-respiratory rate normal, CTA CV-no murmurs,RRR Extremities skin warm dry no edema Neuro grossly normal Behavior normal, alert  Abdomen soft no guarding or rebound or tenderness     Assessment & Plan:  1. Weight loss Labs ordered Healthy diet along with getting calories in discussed Follow-up if progressive troubles  - CBC with Differential - Comprehensive metabolic panel  2. Dyspepsia Pantoprazole daily follow-up in 1 month this will not be a long-term medicine - CBC with Differential - Comprehensive metabolic panel  3. Elevated bilirubin Check lab work await results - CBC with Differential - Comprehensive metabolic panel  4. Elevated TSH Remote history of elevated TSH we will recheck a TSH - TSH  5. Vitamin B12 deficiency Watch for nutritional deficiencies check B12 - Vitamin B12  Recheck in approximately 1 month

## 2022-08-11 LAB — CBC WITH DIFFERENTIAL/PLATELET
Basophils Absolute: 0 10*3/uL (ref 0.0–0.2)
Basos: 1 %
EOS (ABSOLUTE): 0.1 10*3/uL (ref 0.0–0.4)
Eos: 2 %
Hematocrit: 47 % (ref 37.5–51.0)
Hemoglobin: 15.7 g/dL (ref 13.0–17.7)
Immature Grans (Abs): 0 10*3/uL (ref 0.0–0.1)
Immature Granulocytes: 0 %
Lymphocytes Absolute: 2 10*3/uL (ref 0.7–3.1)
Lymphs: 38 %
MCH: 28.3 pg (ref 26.6–33.0)
MCHC: 33.4 g/dL (ref 31.5–35.7)
MCV: 85 fL (ref 79–97)
Monocytes Absolute: 0.5 10*3/uL (ref 0.1–0.9)
Monocytes: 9 %
Neutrophils Absolute: 2.7 10*3/uL (ref 1.4–7.0)
Neutrophils: 50 %
Platelets: 266 10*3/uL (ref 150–450)
RBC: 5.55 x10E6/uL (ref 4.14–5.80)
RDW: 13.3 % (ref 11.6–15.4)
WBC: 5.2 10*3/uL (ref 3.4–10.8)

## 2022-08-11 LAB — COMPREHENSIVE METABOLIC PANEL
ALT: 17 IU/L (ref 0–44)
AST: 14 IU/L (ref 0–40)
Albumin/Globulin Ratio: 2.4
Albumin: 5.1 g/dL (ref 4.3–5.2)
Alkaline Phosphatase: 68 IU/L (ref 44–121)
BUN/Creatinine Ratio: 11 (ref 9–20)
BUN: 11 mg/dL (ref 6–20)
Bilirubin Total: 3.3 mg/dL — ABNORMAL HIGH (ref 0.0–1.2)
CO2: 26 mmol/L (ref 20–29)
Calcium: 9.8 mg/dL (ref 8.7–10.2)
Chloride: 104 mmol/L (ref 96–106)
Creatinine, Ser: 1.01 mg/dL (ref 0.76–1.27)
Globulin, Total: 2.1 g/dL (ref 1.5–4.5)
Glucose: 94 mg/dL (ref 70–99)
Potassium: 4.2 mmol/L (ref 3.5–5.2)
Sodium: 144 mmol/L (ref 134–144)
Total Protein: 7.2 g/dL (ref 6.0–8.5)
eGFR: 109 mL/min/{1.73_m2} (ref >59)

## 2022-08-11 LAB — TSH: TSH: 2.53 u[IU]/mL (ref 0.450–4.500)

## 2022-08-11 LAB — VITAMIN B12: Vitamin B-12: 376 pg/mL (ref 232–1245)

## 2022-08-17 ENCOUNTER — Encounter: Payer: Self-pay | Admitting: Family Medicine

## 2022-08-17 ENCOUNTER — Telehealth: Payer: Self-pay | Admitting: Family Medicine

## 2022-08-17 DIAGNOSIS — R17 Unspecified jaundice: Secondary | ICD-10-CM

## 2022-08-17 NOTE — Telephone Encounter (Signed)
Due to elevated bilirubin recommend ultrasound of right upper quadrant to look at common bile duct  Family is aware please set up and notify family when ultrasound is

## 2022-08-17 NOTE — Telephone Encounter (Signed)
Nurses-please see phone management patient needs ultrasound of the right upper quadrant to look at common bile duct because of elevated bilirubin thank you

## 2022-08-18 ENCOUNTER — Other Ambulatory Visit: Payer: Self-pay

## 2022-08-18 DIAGNOSIS — R1013 Epigastric pain: Secondary | ICD-10-CM

## 2022-08-18 DIAGNOSIS — R17 Unspecified jaundice: Secondary | ICD-10-CM

## 2022-08-19 ENCOUNTER — Ambulatory Visit (HOSPITAL_COMMUNITY): Admission: RE | Admit: 2022-08-19 | Payer: 59 | Source: Ambulatory Visit

## 2022-08-24 ENCOUNTER — Ambulatory Visit (HOSPITAL_COMMUNITY)
Admission: RE | Admit: 2022-08-24 | Discharge: 2022-08-24 | Disposition: A | Discharge status: Home / Self Care | Payer: 59 | Source: Ambulatory Visit | Attending: Family Medicine | Admitting: Family Medicine

## 2022-08-24 DIAGNOSIS — R17 Unspecified jaundice: Secondary | ICD-10-CM | POA: Insufficient documentation

## 2022-09-09 ENCOUNTER — Ambulatory Visit (INDEPENDENT_AMBULATORY_CARE_PROVIDER_SITE_OTHER): Payer: 59 | Admitting: Family Medicine

## 2022-09-09 VITALS — BP 117/75 | HR 80 | Wt 132.8 lb

## 2022-09-09 DIAGNOSIS — R17 Unspecified jaundice: Secondary | ICD-10-CM | POA: Diagnosis not present

## 2022-09-09 DIAGNOSIS — E559 Vitamin D deficiency, unspecified: Secondary | ICD-10-CM | POA: Diagnosis not present

## 2022-09-09 DIAGNOSIS — E538 Deficiency of other specified B group vitamins: Secondary | ICD-10-CM

## 2022-09-09 NOTE — Patient Instructions (Signed)
Mid Oct please do labe  Recheck here in November

## 2022-09-09 NOTE — Progress Notes (Signed)
   Subjective:    Patient ID: Jon Adams, male    DOB: 03/12/2000, 22 y.o.   MRN: [id number]  HPI Patient arrives for follow up. Patient states no concerns or issues.  Patient denies dysphagia nausea vomiting food aversion.  Does not eat much for breakfast does eat decent for lunch and dinner.  Weight has come down some and he is weighing himself every day He also had a lab work and ultrasound ultrasound good bilirubin was elevated Review of Systems     Objective:   Physical Exam General-in no acute distress Eyes-no discharge Lungs-respiratory rate normal, CTA CV-no murmurs,RRR Extremities skin warm dry no edema Neuro grossly normal Behavior normal, alert Abdomen is soft no enlargement of the liver       Assessment & Plan:  1. Elevated bilirubin Check lab works in 3 months.  Bilirubin was 3.3 Ultrasound did look good more than likely Gilbert syndrome - Hepatic Function Panel  2. Vitamin B12 deficiency Take oral B12 on a daily basis.  Check B12 level on the next lab work 3 months - Vitamin B12  3. Vitamin D deficiency Check B12 as well as vitamin D in 3 months - Vitamin D, 25-hydroxy   Check weight once per week try to keep weight in the mid 130s to low 140s follow-up sooner if any problems otherwise follow-up late fall

## 2022-09-25 ENCOUNTER — Ambulatory Visit (INDEPENDENT_AMBULATORY_CARE_PROVIDER_SITE_OTHER): Payer: 59 | Admitting: Family Medicine

## 2022-09-25 VITALS — BP 131/82 | HR 76 | Temp 98.1°F | Ht 69.29 in | Wt 130.4 lb

## 2022-09-25 DIAGNOSIS — R7989 Other specified abnormal findings of blood chemistry: Secondary | ICD-10-CM

## 2022-09-25 DIAGNOSIS — R17 Unspecified jaundice: Secondary | ICD-10-CM

## 2022-09-25 DIAGNOSIS — E559 Vitamin D deficiency, unspecified: Secondary | ICD-10-CM

## 2022-09-25 DIAGNOSIS — R634 Abnormal weight loss: Secondary | ICD-10-CM | POA: Diagnosis not present

## 2022-09-25 DIAGNOSIS — R110 Nausea: Secondary | ICD-10-CM | POA: Diagnosis not present

## 2022-09-25 MED ORDER — ONDANSETRON HCL 8 MG PO TABS: 8.0000 mg | ORAL_TABLET | Freq: Three times a day (TID) | ORAL | 3 refills | Status: AC | PRN

## 2022-09-25 NOTE — Progress Notes (Signed)
   Subjective:    Patient ID: Jon Adams, male    DOB: 2000-12-26, 22 y.o.   MRN: [id number]  HPI Pt comes in today to be seen again for nausea and weight loss. Pt has not been vomiting but it has been hard to eat due to the nausea, pt states that the nausea comes at random times since about April. Pt has lost 17lbs in the past 6 months with 5lbs being since visit on 08/10/2022. Unfortunately patient losing weight Patient feels like at times he is having severe nausea he denies any projectile vomiting he states the nausea prevents him from eating he also to some degree is concerned that her gets sick when he eats so he is cut back on eating His weight is going down He does have underlying autism he is high functioning Very nice patient He does relate intermittent headaches 2-3 times per week that are mild in the top of the head does not wake him up at night does not wake up with them in the morning he is able to control this with Tylenol the headaches do not cause vomiting  Review of Systems     Objective:   Physical Exam  Lungs clear heart regular pulse normal abdomen soft no guarding or rebound      Assessment & Plan:  1. Chronic nausea Referral to gastroenterology Will go ahead with Zofran Check lab work as well - Ambulatory referral to Gastroenterology - Alpha-Gal Panel  2. Weight loss Small meals frequently throughout the day would be reasonable to consider nutritional consult if ongoing troubles - C-reactive protein  3. Elevated TSH Check lab work await the results - TSH + free T4  4. Elevated bilirubin Will go ahead with gastroenterology consult I do not feel the patient needs a scan but because of persistent nausea and weight loss may need EGD He had a similar spell back in 2016 that was secondary to a choking spell - Tissue Transglutaminase Abs,IgG,IgA - C-reactive protein - Comprehensive metabolic panel - CBC with Differential  5. Vitamin D  deficiency Check vitamin D - Vitamin D, 25-hydroxy  I am concerned that anxiety could be contributing into this hold off on serotonin reuptake inhibitor currently but this would be an option counseling could well be an option as well we will move forward with lab testing first

## 2022-09-28 ENCOUNTER — Encounter: Payer: Self-pay | Admitting: Internal Medicine

## 2022-09-30 NOTE — Addendum Note (Signed)
Addended by: Margaretha Sheffield on: 09/30/2022 10:34 AM   Modules accepted: Orders

## 2022-10-13 ENCOUNTER — Ambulatory Visit (INDEPENDENT_AMBULATORY_CARE_PROVIDER_SITE_OTHER): Payer: 59 | Admitting: Family Medicine

## 2022-10-13 ENCOUNTER — Encounter: Payer: Self-pay | Admitting: Family Medicine

## 2022-10-13 VITALS — BP 102/68 | HR 66 | Temp 97.9°F | Ht 69.29 in | Wt 131.0 lb

## 2022-10-13 DIAGNOSIS — R634 Abnormal weight loss: Secondary | ICD-10-CM

## 2022-10-13 DIAGNOSIS — R6881 Early satiety: Secondary | ICD-10-CM

## 2022-10-13 DIAGNOSIS — R7989 Other specified abnormal findings of blood chemistry: Secondary | ICD-10-CM | POA: Diagnosis not present

## 2022-10-13 NOTE — Progress Notes (Signed)
   Subjective:    Patient ID: Jon Adams, male    DOB: Sep 08, 2000, 22 y.o.   MRN: [id number]  HPI Patient was subpar appetite Finds himself feeling full quickly In addition to this at times has full sensation in his upper abdomen Sometimes he only eats a few bites every time she eats better His weight has gone down but he is maintaining at this low weight currently Recent lab work did have a elevated T4 but we are repeating this I do not feel that this is a source of his problems He does have autism and to some degree he has some underlying anxiety but he is high functioning   Review of Systems     Objective:   Physical Exam General-in no acute distress Eyes-no discharge Lungs-respiratory rate normal, CTA CV-no murmurs,RRR Extremities skin warm dry no edema Neuro grossly normal Behavior normal, alert  Abdomen is soft       Assessment & Plan:  1. Weight loss His weight has plateaued.  Will follow closely follow-up again in 8 weeks time encouraged to a more liberal diet as tolerated  2. Elevated serum free T4 level Repeat thyroid function may or may not need to get endocrinology involved  3. Early satiety Patient has appointment with GI coming up available.

## 2022-10-16 NOTE — Addendum Note (Signed)
Addended by: Margaretha Sheffield on: 10/16/2022 10:37 AM   Modules accepted: Orders

## 2022-10-21 ENCOUNTER — Ambulatory Visit (INDEPENDENT_AMBULATORY_CARE_PROVIDER_SITE_OTHER): Payer: 59 | Admitting: Internal Medicine

## 2022-10-21 ENCOUNTER — Encounter: Payer: Self-pay | Admitting: Internal Medicine

## 2022-10-21 VITALS — BP 114/73 | HR 64 | Temp 98.2°F | Ht 69.0 in | Wt 129.2 lb

## 2022-10-21 DIAGNOSIS — R109 Unspecified abdominal pain: Secondary | ICD-10-CM | POA: Diagnosis not present

## 2022-10-21 DIAGNOSIS — R634 Abnormal weight loss: Secondary | ICD-10-CM | POA: Diagnosis not present

## 2022-10-21 DIAGNOSIS — F419 Anxiety disorder, unspecified: Secondary | ICD-10-CM

## 2022-10-21 DIAGNOSIS — R6881 Early satiety: Secondary | ICD-10-CM

## 2022-10-21 NOTE — Patient Instructions (Signed)
We will schedule you for upper endoscopy to further evaluate your early satiety and weight loss.  You can trial off the pantoprazole and see how you do.  If your symptoms worsen then I would restart this medication.  It was very nice meeting both you today.  Dr. Marletta Lor

## 2022-10-21 NOTE — Progress Notes (Signed)
Primary Care Physician:  Jon Sciara, MD Primary Gastroenterologist:  Jon Adams  Chief Complaint  Patient presents with   Nausea    Referred for nausea. Pt states not really having nausea but has a feeling of fullness when eating. Has zofran but has not needed to take any.     HPI:   Jon Adams is a 22 y.o. male who presents to the clinic today by referral from his PCP Jon Adams.  Patient states he had a stomach bug that lasted approximately 1 week with nausea vomiting diarrhea.  This was months ago.  Since that time he has had issues with early satiety, and weight loss.  Has lost nearly 10 pounds.  States he gets full very easily.  Endorses poor appetite.  No dysphagia odynophagia.  Denies any heartburn or reflux.  Was started on pantoprazole a few months ago which does not seem to improve his symptoms.  Alpha gal testing WNL, celiac testing WNL.  TSH normal although does have an elevated T4, awaiting evaluation by endocrinology.  Right upper quadrant ultrasound 08/24/2022 unremarkable.  No melena hematochezia.  States he has mild constipation at times.  Had an EGD in 2016, I do not have access to the report though esophageal, gastric, duodenal biopsies all WNL.  Colonoscopy at the same time again no access to the report though had segmental biopsies which were WNL.  Does note some abdominal discomfort after meals, notes a full feeling.  No excessive bloating or gas.  Also notes underlying anxiety.  Mom believes this may be contributing as it seems like all his symptoms started after recovery from GI infection.    Past Medical History:  Diagnosis Date   Autism    Development delay    Learning disabilities     Past Surgical History:  Procedure Laterality Date   CIRCUMCISION  2003   COLONOSCOPY     DENTAL SURGERY     UPPER GASTROINTESTINAL ENDOSCOPY      Current Outpatient Medications  Medication Sig Dispense Refill   cloNIDine (CATAPRES) 0.1 MG tablet TAKE  (1) TABLET BY MOUTH AT BEDTIME. 30 tablet 0   ondansetron (ZOFRAN) 8 MG tablet Take 1 tablet (8 mg total) by mouth every 8 (eight) hours as needed for nausea or vomiting. 20 tablet 3   pantoprazole (PROTONIX) 40 MG tablet Take 1 tablet (40 mg total) by mouth daily. 30 tablet 3   No current facility-administered medications for this visit.    Allergies as of 10/21/2022 - Review Complete 10/21/2022  Allergen Reaction Noted   Other  10/24/2013    Family History  Problem Relation Age of Onset   Asthma Sister    Anxiety disorder Sister    Diabetes Maternal Grandfather    Hypertension Maternal Grandfather    Hyperlipidemia Maternal Grandfather    Diabetes Paternal Grandmother    Hypertension Paternal Grandmother    Hyperlipidemia Paternal Grandmother    Heart attack Paternal Grandfather        Died at 62   Anxiety disorder Mother    Depression Mother     Social History   Socioeconomic History   Marital status: Single    Spouse name: Not on file   Number of children: Not on file   Years of education: Not on file   Highest education level: 12th grade  Occupational History   Not on file  Tobacco Use   Smoking status: Never   Smokeless tobacco: Never  Substance and  Sexual Activity   Alcohol use: No    Alcohol/week: 0.0 standard drinks of alcohol   Drug use: No   Sexual activity: Never  Other Topics Concern   Not on file  Social History Narrative   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    Jon Adams enjoys Journalist, newspaper, old money, computers, and playing on his phone.    He lives with his parents and siblings.   Social Determinants of Health   Financial Resource Strain: Low Risk  (09/09/2022)   Overall Financial Resource Strain (CARDIA)    Difficulty of Paying Living Expenses: Not hard at all  Food Insecurity: No Food Insecurity (09/09/2022)   Hunger Vital Sign    Worried About Running Out of Food in the Last Year: Never true    Ran Out of Food in the Last Year: Never true   Transportation Needs: No Transportation Needs (09/09/2022)   PRAPARE - Administrator, Civil Service (Medical): No    Lack of Transportation (Non-Medical): No  Physical Activity: Unknown (09/09/2022)   Exercise Vital Sign    Days of Exercise per Week: 0 days    Minutes of Exercise per Session: Not on file  Stress: No Stress Concern Present (09/09/2022)   [facility] of Occupational Health - Occupational Stress Questionnaire    Feeling of Stress : Not at all  Social Connections: Moderately Isolated (09/09/2022)   Social Connection and Isolation Panel [NHANES]    Frequency of Communication with Friends and Family: Three times a week    Frequency of Social Gatherings with Friends and Family: Once a week    Attends Religious Services: More than 4 times per year    Active Member of [employer] or Organizations: No    Attends Engineer, structural: Not on file    Marital Status: Never married  Intimate Partner Violence: Not on file    Subjective: Review of Systems  Constitutional:  Positive for weight loss. Negative for chills and fever.  HENT:  Negative for congestion and hearing loss.   Eyes:  Negative for blurred vision and double vision.  Respiratory:  Negative for cough and shortness of breath.   Cardiovascular:  Negative for chest pain and palpitations.  Gastrointestinal:  Negative for abdominal pain, blood in stool, constipation, diarrhea, heartburn, melena and vomiting.       Early satiety  Genitourinary:  Negative for dysuria and urgency.  Musculoskeletal:  Negative for joint pain and myalgias.  Skin:  Negative for itching and rash.  Neurological:  Negative for dizziness and headaches.  Psychiatric/Behavioral:  Negative for depression. The patient is not nervous/anxious.   All other systems reviewed and are negative.      Objective: BP 114/73 (BP Location: Left Arm, Patient Position: Sitting, Cuff Size: Normal)   Pulse 64   Temp 98.2 F (36.8 C) (Oral)    Ht 5\' 9"  (1.753 m)   Wt 129 lb 3.2 oz (58.6 kg)   BMI 19.08 kg/m  Physical Exam Constitutional:      Appearance: Normal appearance.  HENT:     Head: Normocephalic and atraumatic.  Eyes:     Extraocular Movements: Extraocular movements intact.     Conjunctiva/sclera: Conjunctivae normal.  Cardiovascular:     Rate and Rhythm: Normal rate and regular rhythm.  Pulmonary:     Effort: Pulmonary effort is normal.     Breath sounds: Normal breath sounds.  Abdominal:     General: Bowel sounds are normal.  Palpations: Abdomen is soft.  Musculoskeletal:        General: Normal range of motion.     Cervical back: Normal range of motion and neck supple.  Skin:    General: Skin is warm.  Neurological:     General: No focal deficit present.     Mental Status: He is alert and oriented to person, place, and time.  Psychiatric:        Mood and Affect: Mood normal.        Behavior: Behavior normal.      Assessment: *Early satiety *Weight loss *Abdominal discomfort *Anxiety  Plan: Etiology of patient's symptoms unclear.  Alpha gal testing, celiac testing WNL.  Elevated free T4 which may be contributing, has been referred to endocrinology for further workup.  Right upper quadrant ultrasound unremarkable.  Will schedule for EGD to evaluate for peptic ulcer disease, esophagitis, gastritis, H. Pylori, duodenitis, or other. Will also evaluate for esophageal stricture, Schatzki's ring, esophageal web or other.   The risks including infection, bleed, or perforation as well as benefits, limitations, alternatives and imponderables have been reviewed with the patient. Potential for esophageal dilation, biopsy, etc. have also been reviewed.  Questions have been answered. All parties agreeable.  If EGD unremarkable can consider trial of BuSpar for functional dyspepsia/gastric accomodation.   Does not seem to be getting much benefit from pantoprazole.  Recommend he trial off this medication  and see how he does.  If his symptoms worsen, would recommend he resume this medication.  Thank you Jon Adams for the kind referral  10/21/2022 3:28 PM   Disclaimer: This note was dictated with voice recognition software. Similar sounding words can inadvertently be transcribed and may not be corrected upon review.

## 2022-10-22 ENCOUNTER — Encounter: Payer: Self-pay | Admitting: Internal Medicine

## 2022-10-25 ENCOUNTER — Other Ambulatory Visit: Payer: Self-pay | Admitting: Family Medicine

## 2022-10-29 ENCOUNTER — Encounter: Payer: Self-pay | Admitting: "Endocrinology

## 2022-10-29 ENCOUNTER — Ambulatory Visit (INDEPENDENT_AMBULATORY_CARE_PROVIDER_SITE_OTHER): Payer: 59 | Admitting: "Endocrinology

## 2022-10-29 VITALS — BP 108/76 | HR 60 | Ht 69.0 in | Wt 129.0 lb

## 2022-10-29 DIAGNOSIS — E0590 Thyrotoxicosis, unspecified without thyrotoxic crisis or storm: Secondary | ICD-10-CM | POA: Insufficient documentation

## 2022-10-29 NOTE — Progress Notes (Signed)
Endocrinology Consult Note                                            10/29/2022, 4:48 PM   Subjective:    Patient ID: Jon Adams, male    DOB: 07-24-2000, PCP Babs Sciara, MD   Past Medical History:  Diagnosis Date   Autism    Development delay    Learning disabilities    Past Surgical History:  Procedure Laterality Date   CIRCUMCISION  2003   COLONOSCOPY     DENTAL SURGERY     UPPER GASTROINTESTINAL ENDOSCOPY     WISDOM TOOTH EXTRACTION  2024   Social History   Socioeconomic History   Marital status: Single    Spouse name: Not on file   Number of children: Not on file   Years of education: Not on file   Highest education level: 12th grade  Occupational History   Not on file  Tobacco Use   Smoking status: Never   Smokeless tobacco: Never  Substance and Sexual Activity   Alcohol use: No    Alcohol/week: 0.0 standard drinks of alcohol   Drug use: No   Sexual activity: Never  Other Topics Concern   Not on file  Social History Narrative   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    Makena enjoys Journalist, newspaper, old money, computers, and playing on his phone.    He lives with his parents and siblings.   Social Determinants of Health   Financial Resource Strain: Low Risk  (09/09/2022)   Overall Financial Resource Strain (CARDIA)    Difficulty of Paying Living Expenses: Not hard at all  Food Insecurity: No Food Insecurity (09/09/2022)   Hunger Vital Sign    Worried About Running Out of Food in the Last Year: Never true    Ran Out of Food in the Last Year: Never true  Transportation Needs: No Transportation Needs (09/09/2022)   PRAPARE - Administrator, Civil Service (Medical): No    Lack of Transportation (Non-Medical): No  Physical Activity: Unknown (09/09/2022)   Exercise Vital Sign    Days of Exercise per Week: 0 days    Minutes of Exercise per Session: Not on file  Stress: No Stress Concern Present (09/09/2022)   [name] of  Occupational Health - Occupational Stress Questionnaire    Feeling of Stress : Not at all  Social Connections: Moderately Isolated (09/09/2022)   Social Connection and Isolation Panel [NHANES]    Frequency of Communication with Friends and Family: Three times a week    Frequency of Social Gatherings with Friends and Family: Once a week    Attends Religious Services: More than 4 times per year    Active Member of [employer] or Organizations: No    Attends Engineer, structural: Not on file    Marital Status: Never married   Family History  Problem Relation Age of Onset   Asthma Sister    Anxiety disorder Sister    Diabetes Maternal Grandfather    Hypertension Maternal Grandfather    Hyperlipidemia Maternal Grandfather    Diabetes Paternal Grandmother    Hypertension Paternal Grandmother    Hyperlipidemia Paternal Grandmother    Heart attack Paternal Grandfather        Died at 15   Anxiety disorder Mother  Depression Mother    Outpatient Encounter Medications as of 10/29/2022  Medication Sig   cloNIDine (CATAPRES) 0.1 MG tablet TAKE (1) TABLET BY MOUTH AT BEDTIME.   ondansetron (ZOFRAN) 8 MG tablet Take 1 tablet (8 mg total) by mouth every 8 (eight) hours as needed for nausea or vomiting.   pantoprazole (PROTONIX) 40 MG tablet Take 1 tablet (40 mg total) by mouth daily.   No facility-administered encounter medications on file as of 10/29/2022.   ALLERGIES: Allergies  Allergen Reactions   Other     Cat dander- eyes watery and swollen; runny nose    VACCINATION STATUS: Immunization History  Administered Date(s) Administered   DTaP 06/10/2001, 08/15/2001, 10/17/2001, 10/09/2002, 04/30/2005   HIB (PRP-OMP) 06/10/2001, 08/15/2001, 10/17/2001, 04/10/2002   Hepatitis A 05/07/2011, 06/07/2012   Hepatitis B December 28, 2001, 06/10/2001, 10/17/2001   IPV 06/10/2001, 08/15/2001, 10/09/2002, 04/30/2005   Influenza Split 12/30/2012   Influenza, Seasonal, Injecte, Preservative Fre  11/15/2015   Influenza,inj,Quad PF,6+ Mos 11/30/2013, 11/14/2014, 12/25/2016   Influenza-Unspecified 10/17/2001, 04/10/2002, 01/05/2007, 02/09/2007, 12/14/2007, 02/13/2009, 12/31/2009, 01/08/2012, 12/01/2018, 12/28/2020   MMR 04/10/2002, 04/30/2005   Meningococcal Conjugate 10/25/2012, 05/11/2017   Pneumococcal Conjugate-13 06/10/2001, 08/15/2001   Tdap 10/25/2012   Varicella 04/10/2002, 05/07/2011    HPI Jon Adams is 22 y.o. male who presents today with a medical history as above. he is being seen in consultation for abnormal thyroid function tests requested by Babs Sciara, MD.  Patient with mild pulmonary edema.  He is accompanied by his mother to clinic.  History is obtained directly from the family as well as his chart review.  Due to unexplained 20+ pounds of weight loss over the last 6 months, he was offered thyroid function tests which showed elevated free T4 but normal TSH on 2 separate occasions in July August 2024.   Patient denies palpitations, tremors, nor heat intolerance.  He denies sleep disturbance.  He has no family history of thyroid dysfunction or thyroid malignancy. -He is not currently on antithyroid medications nor thyroid hormone supplements. -he denies any headaches nor visual field deficits.  Review of Systems  Constitutional: + 20 pounds of weight loss over 6 months,  + fatigue, + loss of appetite, no subjective hyperthermia, no subjective hypothermia Eyes: no blurry vision, no xerophthalmia ENT: no sore throat, no nodules palpated in throat, no dysphagia/odynophagia, no hoarseness Cardiovascular: no Chest Pain, no Shortness of Breath, no palpitations, no leg swelling Respiratory: no cough, no shortness of breath Gastrointestinal: no Nausea/Vomiting/Diarhhea Musculoskeletal: no muscle/joint aches Skin: no rashes Neurological: no tremors, no numbness, no tingling, no dizziness Psychiatric: no depression, no anxiety  Objective:       10/29/2022     2:27 PM 10/21/2022    3:13 PM 10/13/2022    3:16 PM  Vitals with BMI  Height 5\' 9"  5\' 9"  5' 9.29"  Weight 129 lbs 129 lbs 3 oz 131 lbs  BMI 19.04 19.07 19.18  Systolic 108 114 161  Diastolic 76 73 68  Pulse 60 64 66    BP 108/76   Pulse 60   Ht 5\' 9"  (1.753 m)   Wt 129 lb (58.5 kg)   BMI 19.05 kg/m   Wt Readings from Last 3 Encounters:  10/29/22 129 lb (58.5 kg)  10/21/22 129 lb 3.2 oz (58.6 kg)  10/13/22 131 lb (59.4 kg)    Physical Exam  Constitutional:  Body mass index is 19.05 kg/m.,  not in acute distress, normal state of mind Eyes: PERRLA, EOMI, no  exophthalmos ENT: moist mucous membranes, no gross thyromegaly, no gross cervical lymphadenopathy Cardiovascular: normal precordial activity, Regular Rate and Rhythm, no Murmur/Rubs/Gallops Respiratory:  adequate breathing efforts, no gross chest deformity, Clear to auscultation bilaterally Gastrointestinal: abdomen soft, Non -tender, No distension, Bowel Sounds present, no gross organomegaly Musculoskeletal: no gross deformities, strength intact in all four extremities, no peripheral edema Skin: moist, warm, no rashes Neurological: no tremor with outstretched hands, Deep tendon reflexes normal in bilateral lower extremities.  CMP ( most recent) CMP     Component Value Date/Time   NA 143 09/25/2022 1640   K 4.4 09/25/2022 1640   CL 103 09/25/2022 1640   CO2 26 09/25/2022 1640   GLUCOSE 91 09/25/2022 1640   BUN 10 09/25/2022 1640   CREATININE 1.15 09/25/2022 1640   CALCIUM 10.0 09/25/2022 1640   PROT 7.0 09/25/2022 1640   ALBUMIN 5.0 09/25/2022 1640   AST 19 09/25/2022 1640   ALT 21 09/25/2022 1640   ALKPHOS 62 09/25/2022 1640   BILITOT 2.8 (H) 09/25/2022 1640   EGFR 93 09/25/2022 1640   GFRNONAA CANCELED 05/15/2014 1203     Lab Results  Component Value Date   TSH 2.570 10/13/2022   TSH 3.660 09/25/2022   TSH 2.530 08/10/2022   TSH 1.890 06/08/2014   TSH 5.210 (H) 05/15/2014   FREET4 1.82 (H) 10/13/2022    FREET4 1.85 (H) 09/25/2022   FREET4 1.76 (H) 05/15/2014      Assessment & Plan:   1. Hyperthyroidism  - [name]  is being seen at a kind request of Luking, Jonna Coup, MD. - I have reviewed his available thyroid records and clinically evaluated the patient. - Based on these reviews, he has elevated free T4 with inappropriately normal TSH on his most recent thyroid function tests,  however, previously.  High TSH of 5.2 associated with high free T4 of 1.76.  -There is some suspicion of secondary hypothyroidism due to TSH producing pituitary adenoma.  However, due to the fact that this is a very rare diagnosis, he will be considered for thyroid uptake and scan first. If his scan is showing significant finding confirming hyperthyroidism, he will be treated accordingly.  If the scan is AP focal, he will be considered for MRI of pituitary/sella to rule out TSHoma.  Patient will return in 2 weeks with his thyroid uptake and scan results. - he is advised to maintain close follow up with Babs Sciara, MD for primary care needs.   -Thank you for involving me in the care of this pleasant patient.  Time spent with the patient: 45  minutes, of which >50% was spent in  counseling him about his abnormal thyroid function tests and the rest in obtaining information about his symptoms, reviewing his previous labs/studies ( including abstractions from other facilities),  evaluations, and treatments,  and developing a plan to confirm diagnosis and long term treatment based on the latest standards of care/guidelines; and documenting his care.  [name] participated in the discussions, expressed understanding, and voiced agreement with the above plans.  All questions were answered to his satisfaction. he is encouraged to contact clinic should he have any questions or concerns prior to his return visit.  Follow up plan: Return in about 2 weeks (around 11/12/2022) for F/U with Thyroid Uptake and  Scan.   Marquis Lunch, MD [facility] [address] [place], [place] [address] Phone: [phone]  Fax: [phone]  10/29/2022, 4:48 PM  This note was partially dictated with voice recognition software. Similar sounding words can be transcribed inadequately or may not  be corrected upon review.

## 2022-11-03 ENCOUNTER — Ambulatory Visit (HOSPITAL_COMMUNITY)
Admission: RE | Admit: 2022-11-03 | Discharge: 2022-11-03 | Disposition: A | Discharge status: Home / Self Care | Payer: 59 | Source: Ambulatory Visit | Attending: "Endocrinology | Admitting: "Endocrinology

## 2022-11-03 ENCOUNTER — Encounter (HOSPITAL_COMMUNITY): Payer: Self-pay

## 2022-11-03 DIAGNOSIS — E0590 Thyrotoxicosis, unspecified without thyrotoxic crisis or storm: Secondary | ICD-10-CM | POA: Diagnosis present

## 2022-11-03 MED ORDER — SODIUM IODIDE I-123 7.4 MBQ CAPS
500.0000 | ORAL_CAPSULE | Freq: Once | ORAL | Status: AC
  Administered 2022-11-03: 444 via ORAL

## 2022-11-04 ENCOUNTER — Encounter (HOSPITAL_COMMUNITY)
Admission: RE | Admit: 2022-11-04 | Discharge: 2022-11-04 | Disposition: A | Discharge status: Home / Self Care | Payer: 59 | Source: Ambulatory Visit | Attending: "Endocrinology | Admitting: "Endocrinology

## 2022-11-04 ENCOUNTER — Encounter: Payer: Self-pay | Admitting: Family Medicine

## 2022-11-09 ENCOUNTER — Ambulatory Visit (INDEPENDENT_AMBULATORY_CARE_PROVIDER_SITE_OTHER): Payer: 59 | Admitting: Family Medicine

## 2022-11-09 VITALS — BP 118/72 | HR 98 | Temp 98.4°F | Ht 69.0 in | Wt 128.6 lb

## 2022-11-09 DIAGNOSIS — F419 Anxiety disorder, unspecified: Secondary | ICD-10-CM | POA: Diagnosis not present

## 2022-11-09 DIAGNOSIS — R6881 Early satiety: Secondary | ICD-10-CM | POA: Diagnosis not present

## 2022-11-09 DIAGNOSIS — R634 Abnormal weight loss: Secondary | ICD-10-CM

## 2022-11-09 MED ORDER — BUSPIRONE HCL 5 MG PO TABS: 5.0000 mg | ORAL_TABLET | Freq: Two times a day (BID) | ORAL | 2 refills | Status: DC

## 2022-11-09 NOTE — Progress Notes (Signed)
   Subjective:    Patient ID: Jon Adams, male    DOB: 04/19/2000, 22 y.o.   MRN: [id number]  HPI Pt comes in today with mother to discuss anxiety issues. Mother states anxiety has always been an issue but in the last couple weeks has gotten worse, mainly social anxiety when in restaurants or crowded places, racing heart  Denies being depressed Does get anxious more so when he goes out They go out almost every single day for months Sometimes he feels anxious when he is involving other people Sometimes it is to the point where he has to go out to the car to eat Denies any chest tightness pressure pain shortness of breath Review of Systems     Objective:   Physical Exam General-in no acute distress Eyes-no discharge Lungs-respiratory rate normal, CTA CV-no murmurs,RRR Extremities skin warm dry no edema Neuro grossly normal Behavior normal, alert 30 minutes spent with patient discussing his symptoms going over what is going on with him currently reviewing the notes documenting       Assessment & Plan:  Currently being worked up for possibility of hyperthyroidism by endocrinology Gastroenterology more than likely looking at setting him up for EGD in the near future Patient occasionally has mild dysphagia but he also states he gets anxious when he eats especially out among people he is not familiar with  Recommend consultation with behavioral health counselor Also recommend BuSpar 5 mg twice daily If this does not do well enough over the next couple weeks we will boost it to 10 mg twice daily Follow-up within 6 weeks  Possibility of using low-dose citalopram and BuSpar does not help

## 2022-11-10 ENCOUNTER — Encounter: Payer: Self-pay | Admitting: Family Medicine

## 2022-11-10 ENCOUNTER — Telehealth: Payer: Self-pay | Admitting: Family Medicine

## 2022-11-10 NOTE — Telephone Encounter (Signed)
  Please cancel medication referral Please put in referral for counseling only Family is preferring [facility] if possible I would prefer [facility] if they are available Diagnosis is autism spectrum disorder with social anxiety which is making it difficult for him to eat and to get out of the house  (He gets anxious when eating in front of others and he is losing weight because of this we currently are managing him from a medication perspective but we need counseling thank you)  Please send Mohib a MyChart message letting him know this has been initiated thank you

## 2022-11-11 ENCOUNTER — Other Ambulatory Visit: Payer: Self-pay

## 2022-11-11 DIAGNOSIS — F419 Anxiety disorder, unspecified: Secondary | ICD-10-CM

## 2022-11-11 DIAGNOSIS — R634 Abnormal weight loss: Secondary | ICD-10-CM

## 2022-11-11 DIAGNOSIS — R6881 Early satiety: Secondary | ICD-10-CM

## 2022-11-11 DIAGNOSIS — F840 Autistic disorder: Secondary | ICD-10-CM

## 2022-11-12 ENCOUNTER — Encounter: Payer: Self-pay | Admitting: "Endocrinology

## 2022-11-12 ENCOUNTER — Ambulatory Visit (INDEPENDENT_AMBULATORY_CARE_PROVIDER_SITE_OTHER): Payer: 59 | Admitting: "Endocrinology

## 2022-11-12 VITALS — BP 108/66 | HR 56 | Ht 69.0 in | Wt 128.0 lb

## 2022-11-12 DIAGNOSIS — R7989 Other specified abnormal findings of blood chemistry: Secondary | ICD-10-CM | POA: Diagnosis not present

## 2022-11-12 DIAGNOSIS — E349 Endocrine disorder, unspecified: Secondary | ICD-10-CM | POA: Diagnosis not present

## 2022-11-12 NOTE — Progress Notes (Signed)
11/12/2022, 5:28 PM  Endocrinology follow-up note   Subjective:    Patient ID: Jon Adams, male    DOB: [birth year], PCP Babs Sciara, MD   Past Medical History:  Diagnosis Date   Autism    Development delay    Learning disabilities    Past Surgical History:  Procedure Laterality Date   CIRCUMCISION  2003   COLONOSCOPY     DENTAL SURGERY     UPPER GASTROINTESTINAL ENDOSCOPY     WISDOM TOOTH EXTRACTION  2024   Social History   Socioeconomic History   Marital status: Single    Spouse name: Not on file   Number of children: Not on file   Years of education: Not on file   Highest education level: 12th grade  Occupational History   Not on file  Tobacco Use   Smoking status: Never   Smokeless tobacco: Never  Substance and Sexual Activity   Alcohol use: No    Alcohol/week: 0.0 standard drinks of alcohol   Drug use: No   Sexual activity: Never  Other Topics Concern   Not on file  Social History Narrative   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    Jon Adams enjoys Journalist, newspaper, old money, computers, and playing on his phone.    He lives with his parents and siblings.   Social Determinants of Health   Financial Resource Strain: Low Risk  (09/09/2022)   Overall Financial Resource Strain (CARDIA)    Difficulty of Paying Living Expenses: Not hard at all  Food Insecurity: No Food Insecurity (09/09/2022)   Hunger Vital Sign    Worried About Running Out of Food in the Last Year: Never true    Ran Out of Food in the Last Year: Never true  Transportation Needs: No Transportation Needs (09/09/2022)   PRAPARE - Administrator, Civil Service (Medical): No    Lack of Transportation (Non-Medical): No  Physical Activity: Unknown (09/09/2022)   Exercise Vital Sign    Days of Exercise per Week: 0 days    Minutes of Exercise per Session: Not on file  Stress: No Stress Concern Present (09/09/2022)   [name] of  Occupational Health - Occupational Stress Questionnaire    Feeling of Stress : Not at all  Social Connections: Moderately Isolated (09/09/2022)   Social Connection and Isolation Panel [NHANES]    Frequency of Communication with Friends and Family: Three times a week    Frequency of Social Gatherings with Friends and Family: Once a week    Attends Religious Services: More than 4 times per year    Active Member of [employer] or Organizations: No    Attends Engineer, structural: Not on file    Marital Status: Never married   Family History  Problem Relation Age of Onset   Asthma Sister    Anxiety disorder Sister    Diabetes Maternal Grandfather    Hypertension Maternal Grandfather    Hyperlipidemia Maternal Grandfather    Diabetes Paternal Grandmother    Hypertension Paternal Grandmother    Hyperlipidemia Paternal Grandmother    Heart attack Paternal Grandfather        Died at 72   Anxiety disorder Mother  Depression Mother    Outpatient Encounter Medications as of 11/12/2022  Medication Sig   busPIRone (BUSPAR) 5 MG tablet Take 1 tablet (5 mg total) by mouth 2 (two) times daily.   cloNIDine (CATAPRES) 0.1 MG tablet TAKE (1) TABLET BY MOUTH AT BEDTIME.   ondansetron (ZOFRAN) 8 MG tablet Take 1 tablet (8 mg total) by mouth every 8 (eight) hours as needed for nausea or vomiting.   pantoprazole (PROTONIX) 40 MG tablet Take 1 tablet (40 mg total) by mouth daily.   No facility-administered encounter medications on file as of 11/12/2022.   ALLERGIES: Allergies  Allergen Reactions   Other     Cat dander- eyes watery and swollen; runny nose    VACCINATION STATUS: Immunization History  Administered Date(s) Administered   DTaP 06/10/2001, 08/15/2001, 10/17/2001, 10/09/2002, 04/30/2005   HIB (PRP-OMP) 06/10/2001, 08/15/2001, 10/17/2001, 04/10/2002   Hepatitis A 05/07/2011, 06/07/2012   Hepatitis B 15-Aug-2001, 06/10/2001, 10/17/2001   IPV 06/10/2001, 08/15/2001, 10/09/2002,  04/30/2005   Influenza Split 12/30/2012   Influenza, Seasonal, Injecte, Preservative Fre 11/15/2015   Influenza,inj,Quad PF,6+ Mos 11/30/2013, 11/14/2014, 12/25/2016   Influenza-Unspecified 10/17/2001, 04/10/2002, 01/05/2007, 02/09/2007, 12/14/2007, 02/13/2009, 12/31/2009, 01/08/2012, 12/01/2018, 12/28/2020   MMR 04/10/2002, 04/30/2005   Meningococcal Conjugate 10/25/2012, 05/11/2017   Pneumococcal Conjugate-13 06/10/2001, 08/15/2001   Tdap 10/25/2012   Varicella 04/10/2002, 05/07/2011    HPI Jon Adams is 22 y.o. male who presents today with a medical history as above. he is being seen in follow-up after he was seen consultation for abnormal thyroid function tests requested by Babs Sciara, MD.  Patient is accompanied by his mother to clinic.    History is obtained directly from the family as well as his chart review.  Due to unexplained 20+ pounds of weight loss over the last 6 months, he was offered thyroid function tests which showed elevated free T4 but normal TSH on 2 separate occasions in July August 2024.    Patient still denies any palpitations, tremors, nor heat intolerance.  His previsit thyroid uptake and scan was unremarkable-see below.   He presents with steady weight compared to his last visit.   He denies sleep disturbance.  He has no family history of thyroid dysfunction or thyroid malignancy.  One of his grandparents might have thyroid dysfunction.  -He is not currently on antithyroid medications nor thyroid hormone supplements. -he denies any headaches nor visual field deficits.  Review of Systems  Constitutional: + steady weight since last visit,   + fatigue, + loss of appetite, no subjective hyperthermia, no subjective hypothermia   Objective:       11/12/2022    9:51 AM 11/09/2022    8:22 AM 10/29/2022    2:27 PM  Vitals with BMI  Height 5\' 9"  5\' 9"  5\' 9"   Weight 128 lbs 128 lbs 10 oz 129 lbs  BMI 18.89 18.98 19.04  Systolic 108 118 161  Diastolic 66  72 76  Pulse 56 98 60    BP 108/66   Pulse (!) 56   Ht 5\' 9"  (1.753 m)   Wt 128 lb (58.1 kg)   BMI 18.90 kg/m   Wt Readings from Last 3 Encounters:  11/12/22 128 lb (58.1 kg)  11/09/22 128 lb 9.6 oz (58.3 kg)  10/29/22 129 lb (58.5 kg)    Physical Exam  Constitutional:  Body mass index is 18.9 kg/m.,  not in acute distress, normal state of mind Eyes: PERRLA, EOMI, no exophthalmos ENT: moist mucous membranes, no gross  thyromegaly, no gross cervical lymphadenopathy   CMP ( most recent) CMP     Component Value Date/Time   NA 143 09/25/2022 1640   K 4.4 09/25/2022 1640   CL 103 09/25/2022 1640   CO2 26 09/25/2022 1640   GLUCOSE 91 09/25/2022 1640   BUN 10 09/25/2022 1640   CREATININE 1.15 09/25/2022 1640   CALCIUM 10.0 09/25/2022 1640   PROT 7.0 09/25/2022 1640   ALBUMIN 5.0 09/25/2022 1640   AST 19 09/25/2022 1640   ALT 21 09/25/2022 1640   ALKPHOS 62 09/25/2022 1640   BILITOT 2.8 (H) 09/25/2022 1640   EGFR 93 09/25/2022 1640   GFRNONAA CANCELED 05/15/2014 1203     Lab Results  Component Value Date   TSH 2.570 10/13/2022   TSH 3.660 09/25/2022   TSH 2.530 08/10/2022   TSH 1.890 06/08/2014   TSH 5.210 (H) 05/15/2014   FREET4 1.82 (H) 10/13/2022   FREET4 1.85 (H) 09/25/2022   FREET4 1.76 (H) 05/15/2014     Thyroid uptake and scan findings on November 04, 2022 FINDINGS: 4 hour I-123 uptake = 9.5% (normal 5-20%),  24 hour I-123 uptake = 25.0% (normal 10-30%)   Thyroid imaging demonstrates homogeneous thyroid activity bilaterally. No focal hot or cold nodules are identified.   IMPRESSION: Normal thyroid uptake and scan.   Assessment & Plan:   1.  Abnormal thyroid labs:   - [name]  is being seen at a kind request of Luking, Jonna Coup, MD. - I have reviewed his  new and available thyroid records and clinically evaluated the patient. - Based on these reviews, he has elevated free T4 with inappropriately normal TSH on his most recent thyroid  function tests,  however, previously.  High TSH of 5.2 associated with high free T4 of 1.76.  His previsit thyroid uptake and scan is unremarkable-25% uptake in 24 hours with no focal abnormalities. -He will not need ablative treatment nor antithyroid intervention at this time. -I had a long discussion with the family about his findings.  2 rare possibilities to explain his presentation include TSH producing pituitary adenoma or thyroid hormone resistance.  He will have repeat labs for thyroid function including pituitary evaluation with alpha subunit before his next visit in 9 weeks.    If he presents with elevated alpha subunit, he will be considered for military/sella MRI. If he continues to present with abnormal thyroid hormone labs without pituitary adenoma, working diagnosis could still be thyroid hormone replacement which does not need any specific treatment but may require genetic analysis for mutations of thyroid hormone receptor to be discussed further.   Patient will return in 2 weeks with his thyroid uptake and scan results. - he is advised to maintain close follow up with Babs Sciara, MD for primary care needs.   I spent  25  minutes in the care of the patient today including review of labs from Thyroid Function, CMP, and other relevant labs ; imaging/biopsy records (current and previous including abstractions from other facilities); face-to-face time discussing  his lab results and symptoms, medications doses, his options of short and long term treatment based on the latest standards of care / guidelines;   and documenting the encounter.  [name]  participated in the discussions, expressed understanding, and voiced agreement with the above plans.  All questions were answered to his satisfaction. he is encouraged to contact clinic should he have any questions or concerns prior to his return visit.  Follow up plan: Return in about 9 weeks (around 01/14/2023) for Fasting  Labs  in AM B4 8.   Marquis Lunch, MD [facility] [address] [place], [place] [address] Phone: [phone]  Fax: [phone]     11/12/2022, 5:28 PM  This note was partially dictated with voice recognition software. Similar sounding words can be transcribed inadequately or may not  be corrected upon review.

## 2022-11-17 ENCOUNTER — Encounter: Payer: Self-pay | Admitting: *Deleted

## 2022-11-17 NOTE — Telephone Encounter (Signed)
Per [facility] for [place] "Notification/Precertification Requirement This member's plan does not currently require notification or prior-authorization through the [employer] Notification or Prior-Authorization Program. Please contact a Customer Care Professional at [phone] if you believe the information returned to be in error."

## 2022-11-26 ENCOUNTER — Ambulatory Visit: Payer: 59 | Admitting: Family Medicine

## 2022-12-08 ENCOUNTER — Other Ambulatory Visit: Payer: Self-pay

## 2022-12-08 ENCOUNTER — Encounter (HOSPITAL_COMMUNITY): Payer: Self-pay

## 2022-12-08 ENCOUNTER — Encounter (HOSPITAL_COMMUNITY)
Admission: RE | Disposition: A | Discharge status: Home / Self Care | Payer: Self-pay | Source: Home / Self Care | Attending: Internal Medicine

## 2022-12-08 ENCOUNTER — Ambulatory Visit (HOSPITAL_COMMUNITY): Payer: 59 | Admitting: Anesthesiology

## 2022-12-08 ENCOUNTER — Ambulatory Visit (HOSPITAL_COMMUNITY)
Admission: RE | Admit: 2022-12-08 | Discharge: 2022-12-08 | Disposition: A | Discharge status: Home / Self Care | Payer: 59 | Attending: Internal Medicine | Admitting: Internal Medicine

## 2022-12-08 DIAGNOSIS — R634 Abnormal weight loss: Secondary | ICD-10-CM | POA: Insufficient documentation

## 2022-12-08 DIAGNOSIS — R1013 Epigastric pain: Secondary | ICD-10-CM | POA: Diagnosis present

## 2022-12-08 DIAGNOSIS — R109 Unspecified abdominal pain: Secondary | ICD-10-CM | POA: Diagnosis not present

## 2022-12-08 DIAGNOSIS — Z6830 Body mass index (BMI) 30.0-30.9, adult: Secondary | ICD-10-CM | POA: Diagnosis not present

## 2022-12-08 DIAGNOSIS — K2970 Gastritis, unspecified, without bleeding: Secondary | ICD-10-CM | POA: Diagnosis not present

## 2022-12-08 DIAGNOSIS — R6881 Early satiety: Secondary | ICD-10-CM | POA: Insufficient documentation

## 2022-12-08 DIAGNOSIS — K2950 Unspecified chronic gastritis without bleeding: Secondary | ICD-10-CM

## 2022-12-08 HISTORY — PX: ESOPHAGOGASTRODUODENOSCOPY (EGD) WITH PROPOFOL: SHX5813

## 2022-12-08 HISTORY — PX: BIOPSY: SHX5522

## 2022-12-08 SURGERY — ESOPHAGOGASTRODUODENOSCOPY (EGD) WITH PROPOFOL
Anesthesia: General

## 2022-12-08 MED ORDER — PROPOFOL 10 MG/ML IV BOLUS
INTRAVENOUS | Status: DC | PRN
  Administered 2022-12-08 (×5): 50 mg via INTRAVENOUS

## 2022-12-08 MED ORDER — LACTATED RINGERS IV SOLN: INTRAVENOUS | Status: DC

## 2022-12-08 MED ORDER — STERILE WATER FOR IRRIGATION IR SOLN
Status: DC | PRN
  Administered 2022-12-08: 60 mL

## 2022-12-08 NOTE — Discharge Instructions (Addendum)
EGD Discharge instructions Please read the instructions outlined below and refer to this sheet in the next few weeks. These discharge instructions provide you with general information on caring for yourself after you leave the hospital. Your doctor may also give you specific instructions. While your treatment has been planned according to the most current medical practices available, unavoidable complications occasionally occur. If you have any problems or questions after discharge, please call your doctor. ACTIVITY You may resume your regular activity but move at a slower pace for the next 24 hours.  Take frequent rest periods for the next 24 hours.  Walking will help expel (get rid of) the air and reduce the bloated feeling in your abdomen.  No driving for 24 hours (because of the anesthesia (medicine) used during the test).  You may shower.  Do not sign any important legal documents or operate any machinery for 24 hours (because of the anesthesia used during the test).  NUTRITION Drink plenty of fluids.  You may resume your normal diet.  Begin with a light meal and progress to your normal diet.  Avoid alcoholic beverages for 24 hours or as instructed by your caregiver.  MEDICATIONS You may resume your normal medications unless your caregiver tells you otherwise.  WHAT YOU CAN EXPECT TODAY You may experience abdominal discomfort such as a feeling of fullness or "gas" pains.  FOLLOW-UP Your doctor will discuss the results of your test with you.  SEEK IMMEDIATE MEDICAL ATTENTION IF ANY OF THE FOLLOWING OCCUR: Excessive nausea (feeling sick to your stomach) and/or vomiting.  Severe abdominal pain and distention (swelling).  Trouble swallowing.  Temperature over 101 F (37.8 C).  Rectal bleeding or vomiting of blood.   Your EGD revealed mild amount inflammation in your stomach.  I took biopsies of this to rule out infection with a bacteria called H. pylori.    I also took samples of your  esophagus.   Small bowel appeared normal  Await pathology results, my office will contact you.  Continue on BuSpar and pantoprazole.  Follow-up in GI office in 2 to 3 months.  Office will notify you.   I hope you have a great rest of your week!  Hennie Duos. Marletta Lor, D.O. Gastroenterology and Hepatology [facility]

## 2022-12-08 NOTE — Transfer of Care (Signed)
Immediate Anesthesia Transfer of Care Note  Patient: Jon Adams  Procedure(s) Performed: ESOPHAGOGASTRODUODENOSCOPY (EGD) WITH PROPOFOL BIOPSY  Patient Location: Short Stay  Anesthesia Type:General  Level of Consciousness: awake, alert , oriented, and patient cooperative  Airway & Oxygen Therapy: Patient Spontanous Breathing  Post-op Assessment: Report given to RN, Post -op Vital signs reviewed and stable, and Patient moving all extremities X 4  Post vital signs: Reviewed and stable  Last Vitals:  Vitals Value Taken Time  BP 87/51 12/08/22 1020  Temp 36.6 C 12/08/22 1020  Pulse 83 12/08/22 1020  Resp 20 12/08/22 1020  SpO2 98 % 12/08/22 1020    Last Pain:  Vitals:   12/08/22 1020  TempSrc: Oral  PainSc: 0-No pain      Patients Stated Pain Goal: 7 (12/08/22 0836)  Complications: No notable events documented.

## 2022-12-08 NOTE — Op Note (Signed)
[facility] Patient Name: Jon Adams Procedure Date: 12/08/2022 9:53 AM MRN: [id number] Date of Birth: [birth year] Attending MD: Hennie Duos. Marletta Lor , [place], [id number] CSN: [id number] Age: 22 Admit Type: Outpatient Procedure:                Upper GI endoscopy Indications:              Epigastric abdominal pain, Early satiety, Weight                            loss Providers:                Hennie Duos. Marletta Lor, DO, Crystal Page, Francoise Ceo                            RN, RN, Elinor Parkinson Referring MD:              Medicines:                See Jon Anesthesia note for documentation of Jon                            administered medications Complications:            No immediate complications. Estimated Blood Loss:     Estimated blood loss was minimal. Procedure:                Pre-Anesthesia Assessment:                           - Jon anesthesia plan was to use monitored                            anesthesia care (MAC).                           After obtaining informed consent, Jon endoscope was                            passed under direct vision. Throughout Jon                            procedure, Jon patient's blood pressure, pulse, and                            oxygen saturations were monitored continuously. Jon                            [id number]-[id number] ([id number]) scope was introduced through Jon                            mouth, and advanced to Jon second part of duodenum.                            Jon upper GI endoscopy was accomplished without                            difficulty. Jon patient tolerated  Jon procedure                            well. Scope In: 10:10:46 AM Scope Out: 10:16:07 AM Total Procedure Duration: 0 hours 5 minutes 21 seconds  Findings:      Jon examined esophagus was normal. Biopsies were taken with a cold       forceps for histology of Jon middle esophagus.      Patchy mild inflammation characterized by erythema was found in Jon       gastric body  and in Jon gastric antrum. Biopsies were taken with a cold       forceps for Helicobacter pylori testing.      Jon duodenal bulb, first portion of Jon duodenum and second portion of       Jon duodenum were normal. Impression:               - Normal esophagus. Biopsied.                           - Gastritis. Biopsied.                           - Normal duodenal bulb, first portion of Jon                            duodenum and second portion of Jon duodenum. Moderate Sedation:      Per Anesthesia Care Recommendation:           - Patient has a contact number available for                            emergencies. Jon signs and symptoms of potential                            delayed complications were discussed with Jon                            patient. Return to normal activities tomorrow.                            Written discharge instructions were provided to Jon                            patient.                           - Resume previous diet.                           - Continue present medications.                           - Await pathology results.                           - Currently on trial of BuSpar for functional  dyspepsia/gastric accomodation.                           - Return to GI clinic in 3 months. Procedure Code(s):        --- Professional ---                           [id number], Esophagogastroduodenoscopy, flexible,                            transoral; with biopsy, single or multiple Diagnosis Code(s):        --- Professional ---                           K29.70, Gastritis, unspecified, without bleeding                           R10.13, Epigastric pain                           R68.81, Early satiety                           R63.4, Abnormal weight loss CPT copyright 2022 American Medical Association. All rights reserved. Jon codes documented in this report are preliminary and upon coder review may  be revised to meet current compliance  requirements. Hennie Duos. Marletta Lor, DO Hennie Duos. Junie Avilla, DO 12/08/2022 10:20:17 AM This report has been signed electronically. Number of Addenda: 0

## 2022-12-08 NOTE — Anesthesia Preprocedure Evaluation (Signed)
Anesthesia Evaluation  Patient identified by MRN, date of birth, ID band Patient awake    Reviewed: Allergy & Precautions, H&P , NPO status , Patient's Chart, lab work & pertinent test results, reviewed documented beta blocker date and time   Airway Mallampati: II  TM Distance: >3 FB Neck ROM: full    Dental no notable dental hx.    Pulmonary neg pulmonary ROS   Pulmonary exam normal breath sounds clear to auscultation       Cardiovascular Exercise Tolerance: Good negative cardio ROS  Rhythm:regular Rate:Normal     Neuro/Psych  PSYCHIATRIC DISORDERS Anxiety     negative neurological ROS  negative psych ROS   GI/Hepatic negative GI ROS, Neg liver ROS,,,  Endo/Other  negative endocrine ROS Hyperthyroidism   Renal/GU negative Renal ROS  negative genitourinary   Musculoskeletal   Abdominal   Peds  Hematology negative hematology ROS (+)   Anesthesia Other Findings   Reproductive/Obstetrics negative OB ROS                             Anesthesia Physical Anesthesia Plan  ASA: 2  Anesthesia Plan: General   Post-op Pain Management:    Induction:   PONV Risk Score and Plan: Propofol infusion  Airway Management Planned:   Additional Equipment:   Intra-op Plan:   Post-operative Plan:   Informed Consent: I have reviewed the patients History and Physical, chart, labs and discussed the procedure including the risks, benefits and alternatives for the proposed anesthesia with the patient or authorized representative who has indicated his/her understanding and acceptance.     Dental Advisory Given  Plan Discussed with: CRNA  Anesthesia Plan Comments:        Anesthesia Quick Evaluation

## 2022-12-08 NOTE — H&P (Signed)
Primary Care Physician:  Babs Sciara, MD Primary Gastroenterologist:  Dr. Marletta Lor  Pre-Procedure History & Physical: HPI:  Jon Adams is a 22 y.o. male is here for an EGD due to history for Early satiety, Weight loss, Abdominal discomfort  Past Medical History:  Diagnosis Date   Autism    Development delay    Learning disabilities     Past Surgical History:  Procedure Laterality Date   CIRCUMCISION  2003   COLONOSCOPY     DENTAL SURGERY     UPPER GASTROINTESTINAL ENDOSCOPY     WISDOM TOOTH EXTRACTION  2024    Prior to Admission medications   Medication Sig Start Date End Date Taking? Authorizing Provider  busPIRone (BUSPAR) 5 MG tablet Take 1 tablet (5 mg total) by mouth 2 (two) times daily. 11/09/22  Yes Luking, Jonna Coup, MD  cloNIDine (CATAPRES) 0.1 MG tablet TAKE (1) TABLET BY MOUTH AT BEDTIME. 06/10/22  Yes Holland Falling, NP  pantoprazole (PROTONIX) 40 MG tablet Take 1 tablet (40 mg total) by mouth daily. 08/10/22  Yes Babs Sciara, MD  ondansetron (ZOFRAN) 8 MG tablet Take 1 tablet (8 mg total) by mouth every 8 (eight) hours as needed for nausea or vomiting. 09/25/22   Babs Sciara, MD    Allergies as of 11/17/2022 - Review Complete 11/12/2022  Allergen Reaction Noted   Other  10/24/2013    Family History  Problem Relation Age of Onset   Asthma Sister    Anxiety disorder Sister    Diabetes Maternal Grandfather    Hypertension Maternal Grandfather    Hyperlipidemia Maternal Grandfather    Diabetes Paternal Grandmother    Hypertension Paternal Grandmother    Hyperlipidemia Paternal Grandmother    Heart attack Paternal Grandfather        Died at 1   Anxiety disorder Mother    Depression Mother     Social History   Socioeconomic History   Marital status: Single    Spouse name: Not on file   Number of children: Not on file   Years of education: Not on file   Highest education level: 12th grade  Occupational History   Not on file  Tobacco Use    Smoking status: Never   Smokeless tobacco: Never  Substance and Sexual Activity   Alcohol use: No    Alcohol/week: 0.0 standard drinks of alcohol   Drug use: No   Sexual activity: Never  Other Topics Concern   Not on file  Social History Narrative   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    Jakyron enjoys Journalist, newspaper, old money, computers, and playing on his phone.    He lives with his parents and siblings.   Social Determinants of Health   Financial Resource Strain: Low Risk  (09/09/2022)   Overall Financial Resource Strain (CARDIA)    Difficulty of Paying Living Expenses: Not hard at all  Food Insecurity: No Food Insecurity (09/09/2022)   Hunger Vital Sign    Worried About Running Out of Food in the Last Year: Never true    Ran Out of Food in the Last Year: Never true  Transportation Needs: No Transportation Needs (09/09/2022)   PRAPARE - Administrator, Civil Service (Medical): No    Lack of Transportation (Non-Medical): No  Physical Activity: Unknown (09/09/2022)   Exercise Vital Sign    Days of Exercise per Week: 0 days    Minutes of Exercise per Session: Not on file  Stress: No Stress Concern Present (09/09/2022)   [name]    Feeling of Stress : Not at all  Social Connections: Moderately Isolated (09/09/2022)   Social Connection and Isolation Panel [NHANES]    Frequency of Communication with Friends and Family: Three times a week    Frequency of Social Gatherings with Friends and Family: Once a week    Attends Religious Services: More than 4 times per year    Active Member of [employer] or Organizations: No    Attends Engineer, structural: Not on file    Marital Status: Never married  Intimate Partner Violence: Not on file    Review of Systems: General: Negative for fever, chills, fatigue, weakness. Eyes: Negative for vision changes.  ENT: Negative for hoarseness, difficulty swallowing ,  nasal congestion. CV: Negative for chest pain, angina, palpitations, dyspnea on exertion, peripheral edema.  Respiratory: Negative for dyspnea at rest, dyspnea on exertion, cough, sputum, wheezing.  GI: See history of present illness. GU:  Negative for dysuria, hematuria, urinary incontinence, urinary frequency, nocturnal urination.  MS: Negative for joint pain, low back pain.  Derm: Negative for rash or itching.  Neuro: Negative for weakness, abnormal sensation, seizure, frequent headaches, memory loss, confusion.  Psych: Negative for anxiety, depression Endo: Negative for unusual weight change.  Heme: Negative for bruising or bleeding. Allergy: Negative for rash or hives.  Physical Exam: Vital signs in last 24 hours: Temp:  [99.2 F (37.3 C)] 99.2 F (37.3 C) (10/08 0836) Pulse Rate:  [98] 98 (10/08 0836) Resp:  [16] 16 (10/08 0836) BP: (139)/(73) 139/73 (10/08 0836) Weight:  [100.2 kg] 100.2 kg (10/08 0836)   General:   Alert,  Well-developed, well-nourished, pleasant and cooperative in NAD Head:  Normocephalic and atraumatic. Eyes:  Sclera clear, no icterus.   Conjunctiva pink. Ears:  Normal auditory acuity. Nose:  No deformity, discharge,  or lesions. Msk:  Symmetrical without gross deformities. Normal posture. Extremities:  Without clubbing or edema. Neurologic:  Alert and  oriented x4;  grossly normal neurologically. Skin:  Intact without significant lesions or rashes. Psych:  Alert and cooperative. Normal mood and affect.   Impression/Plan: Jon Adams is here for an EGD to be performed for Early satiety, Weight loss, Abdominal discomfort  Risks, benefits, limitations, imponderables and alternatives regarding procedure have been reviewed with the patient. Questions have been answered. All parties agreeable.

## 2022-12-09 ENCOUNTER — Ambulatory Visit (INDEPENDENT_AMBULATORY_CARE_PROVIDER_SITE_OTHER): Payer: 59 | Admitting: Family Medicine

## 2022-12-09 VITALS — BP 104/78 | HR 60 | Ht 71.0 in | Wt 127.4 lb

## 2022-12-09 DIAGNOSIS — F840 Autistic disorder: Secondary | ICD-10-CM | POA: Diagnosis not present

## 2022-12-09 DIAGNOSIS — R634 Abnormal weight loss: Secondary | ICD-10-CM

## 2022-12-09 DIAGNOSIS — F419 Anxiety disorder, unspecified: Secondary | ICD-10-CM

## 2022-12-09 DIAGNOSIS — R6881 Early satiety: Secondary | ICD-10-CM

## 2022-12-09 LAB — SURGICAL PATHOLOGY

## 2022-12-10 NOTE — Progress Notes (Signed)
   Subjective:    Patient ID: Jon Adams, male    DOB: 2000/05/30, 22 y.o.   MRN: [id number]  Discussed the use of AI scribe software for clinical note transcription with the patient, who gave verbal consent to proceed.  History of Present Illness   The patient, Jon Adams, recently underwent an endoscopy procedure. Following the procedure, he reported discomfort when swallowing, particularly noticeable when drinking water. He described the sensation as if he could feel the liquid moving down his throat, localized in the upper chest area. However, he was able to eat and drink without significant difficulty. The discomfort seemed to be improving slightly over time.  In terms of his overall health, the patient reported a stable energy level, even with increased physical activity such as walking during a recent trip to [facility]. His appetite was variable, with some days of good intake and others of minimal eating.  The patient also reported episodes of anxiety, particularly in situations that were unfamiliar or potentially stressful. He mentioned an incident of heightened nervousness during a train ride at [facility]. He was currently on medication for anxiety, which he felt was somewhat helpful, but he still experienced anxious episodes. The medication sometimes caused drowsiness, but this was not consistent.  The patient was taking his anxiety medication twice a day, once in the morning and once at night. He reported no other new medications. He had been in contact with a counselor, but the details of this were not discussed in depth during the consultation.  The patient's weight was a concern, with a goal set to maintain his current weight at the very least, and ideally to gain a small amount. The patient was tall and naturally slender, so significant weight gain was not expected. The patient agreed with this plan.         Review of Systems     Objective:    Physical Exam   CHEST: lungs  clear to auscultation CARDIOVASCULAR: normal heart sounds           Assessment & Plan:  Assessment and Plan    Post-Endoscopy Discomfort Reports feeling the sensation of water going down after the procedure. Expected to improve over the next few days. -Continue to monitor and expect resolution of symptoms.  Anxiety Reports variable anxiety levels, sometimes exacerbated by certain situations. Currently on Buspar, which sometimes causes sleepiness. -Adjust Buspar dosing to 9-10am and 2-3pm for better coverage during waking hours. -Consider increasing Buspar to 10mg  twice daily if needed. -Encourage engagement with counseling services when contacted.  Weight Loss Variable appetite with some days of good intake and others of poor intake. Goal is to maintain current weight and possibly gain 5-10 pounds over the next three months. -Encourage consistent daily caloric intake.  Legal Representation Discussed the possibility of obtaining a power of attorney for healthcare decisions. -Consider discussing with clerk of courts or a lawyer about the process.  Follow-up in approximately three months to assess weight, anxiety management, and overall health.

## 2022-12-11 NOTE — Anesthesia Postprocedure Evaluation (Signed)
Anesthesia Post Note  Patient: Jon Adams Anes Peden  Procedure(s) Performed: ESOPHAGOGASTRODUODENOSCOPY (EGD) WITH PROPOFOL BIOPSY  Patient location during evaluation: Phase II Anesthesia Type: General Level of consciousness: awake Pain management: pain level controlled Vital Signs Assessment: post-procedure vital signs reviewed and stable Respiratory status: spontaneous breathing and respiratory function stable Cardiovascular status: blood pressure returned to baseline and stable Postop Assessment: no headache and no apparent nausea or vomiting Anesthetic complications: no Comments: Late entry   No notable events documented.   Last Vitals:  Vitals:   12/08/22 1020 12/08/22 1023  BP: (!) 87/51 102/61  Pulse: 83 80  Resp: 20 20  Temp: 36.6 C   SpO2: 98% 97%    Last Pain:  Vitals:   12/08/22 1023  TempSrc:   PainSc: 0-No pain                 Windell Norfolk

## 2022-12-16 ENCOUNTER — Encounter (HOSPITAL_COMMUNITY): Payer: Self-pay | Admitting: Internal Medicine

## 2022-12-22 ENCOUNTER — Other Ambulatory Visit (INDEPENDENT_AMBULATORY_CARE_PROVIDER_SITE_OTHER): Payer: Self-pay | Admitting: Family

## 2022-12-22 DIAGNOSIS — G4700 Insomnia, unspecified: Secondary | ICD-10-CM

## 2023-01-03 ENCOUNTER — Encounter: Payer: Self-pay | Admitting: Family Medicine

## 2023-01-04 MED ORDER — BUSPIRONE HCL 10 MG PO TABS: ORAL_TABLET | ORAL | 3 refills | Status: DC

## 2023-01-04 NOTE — Telephone Encounter (Signed)
Nurses May change directions on the medication to BuSpar 10 mg Directions 1 taken 3 times daily as needed #90 3 refills Follow-up as planned Notify us if any ongoing troubles or problems

## 2023-01-11 ENCOUNTER — Ambulatory Visit: Payer: 59 | Admitting: Family Medicine

## 2023-01-14 ENCOUNTER — Encounter: Payer: Self-pay | Admitting: Gastroenterology

## 2023-01-17 ENCOUNTER — Other Ambulatory Visit: Payer: Self-pay | Admitting: Family Medicine

## 2023-02-11 ENCOUNTER — Ambulatory Visit (INDEPENDENT_AMBULATORY_CARE_PROVIDER_SITE_OTHER): Payer: 59 | Admitting: "Endocrinology

## 2023-02-11 ENCOUNTER — Ambulatory Visit: Payer: 59 | Admitting: "Endocrinology

## 2023-02-11 ENCOUNTER — Encounter: Payer: Self-pay | Admitting: "Endocrinology

## 2023-02-11 VITALS — BP 104/74 | HR 68 | Ht 71.0 in | Wt 130.0 lb

## 2023-02-11 DIAGNOSIS — R7989 Other specified abnormal findings of blood chemistry: Secondary | ICD-10-CM | POA: Insufficient documentation

## 2023-02-11 DIAGNOSIS — E349 Endocrine disorder, unspecified: Secondary | ICD-10-CM | POA: Diagnosis not present

## 2023-02-11 NOTE — Progress Notes (Signed)
02/11/2023, 11:22 AM  Endocrinology follow-up note   Subjective:    Patient ID: Jon Adams, male    DOB: 2000-05-29, PCP Babs Sciara, MD   Past Medical History:  Diagnosis Date   Autism    Development delay    Learning disabilities    Past Surgical History:  Procedure Laterality Date   BIOPSY  12/08/2022   Procedure: BIOPSY;  Surgeon: Lanelle Bal, DO;  Location: [facility];  Service: Endoscopy;;   CIRCUMCISION  2003   COLONOSCOPY     DENTAL SURGERY     ESOPHAGOGASTRODUODENOSCOPY (EGD) WITH PROPOFOL N/A 12/08/2022   Procedure: ESOPHAGOGASTRODUODENOSCOPY (EGD) WITH PROPOFOL;  Surgeon: Lanelle Bal, DO;  Location: [facility];  Service: Endoscopy;  Laterality: N/A;  945am, asa 2   UPPER GASTROINTESTINAL ENDOSCOPY     WISDOM TOOTH EXTRACTION  2024   Social History   Socioeconomic History   Marital status: Single    Spouse name: Not on file   Number of children: Not on file   Years of education: Not on file   Highest education level: 12th grade  Occupational History   Not on file  Tobacco Use   Smoking status: Never   Smokeless tobacco: Never  Substance and Sexual Activity   Alcohol use: No    Alcohol/week: 0.0 standard drinks of alcohol   Drug use: No   Sexual activity: Never  Other Topics Concern   Not on file  Social History Narrative   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    Thomes enjoys Journalist, newspaper, old money, computers, and playing on his phone.    He lives with his parents and siblings.   Social Drivers of Corporate investment banker Strain: Low Risk  (09/09/2022)   Overall Financial Resource Strain (CARDIA)    Difficulty of Paying Living Expenses: Not hard at all  Food Insecurity: No Food Insecurity (09/09/2022)   Hunger Vital Sign    Worried About Running Out of Food in the Last Year: Never true    Ran Out of Food in the Last Year: Never true  Transportation Needs: No  Transportation Needs (09/09/2022)   PRAPARE - Administrator, Civil Service (Medical): No    Lack of Transportation (Non-Medical): No  Physical Activity: Unknown (09/09/2022)   Exercise Vital Sign    Days of Exercise per Week: 0 days    Minutes of Exercise per Session: Not on file  Stress: No Stress Concern Present (09/09/2022)   [name]    Feeling of Stress : Not at all  Social Connections: Moderately Isolated (09/09/2022)   Social Connection and Isolation Panel [NHANES]    Frequency of Communication with Friends and Family: Three times a week    Frequency of Social Gatherings with Friends and Family: Once a week    Attends Religious Services: More than 4 times per year    Active Member of [employer] or Organizations: No    Attends Engineer, structural: Not on file    Marital Status: Never married   Family History  Problem Relation Age of Onset   Asthma Sister    Anxiety disorder  Sister    Diabetes Maternal Grandfather    Hypertension Maternal Grandfather    Hyperlipidemia Maternal Grandfather    Diabetes Paternal Grandmother    Hypertension Paternal Grandmother    Hyperlipidemia Paternal Grandmother    Heart attack Paternal Grandfather        Died at 67   Anxiety disorder Mother    Depression Mother    Outpatient Encounter Medications as of 02/11/2023  Medication Sig   busPIRone (BUSPAR) 10 MG tablet BuSpar 10 mg Directions 1 taken 3 times daily as needed #90 3 refills   cloNIDine (CATAPRES) 0.1 MG tablet TAKE (1) TABLET BY MOUTH AT BEDTIME.   ondansetron (ZOFRAN) 8 MG tablet Take 1 tablet (8 mg total) by mouth every 8 (eight) hours as needed for nausea or vomiting.   pantoprazole (PROTONIX) 40 MG tablet TAKE ONE TABLET BY MOUTH ONCE DAILY.   No facility-administered encounter medications on file as of 02/11/2023.   ALLERGIES: Allergies  Allergen Reactions   Other     Cat dander- eyes  watery and swollen; runny nose    VACCINATION STATUS: Immunization History  Administered Date(s) Administered   DTaP 06/10/2001, 08/15/2001, 10/17/2001, 10/09/2002, 04/30/2005   HIB (PRP-OMP) 06/10/2001, 08/15/2001, 10/17/2001, 04/10/2002   Hepatitis A 05/07/2011, 06/07/2012   Hepatitis B 12-25-01, 06/10/2001, 10/17/2001   IPV 06/10/2001, 08/15/2001, 10/09/2002, 04/30/2005   Influenza Split 12/30/2012   Influenza, Seasonal, Injecte, Preservative Fre 11/15/2015   Influenza,inj,Quad PF,6+ Mos 11/30/2013, 11/14/2014, 12/25/2016   Influenza-Unspecified 10/17/2001, 04/10/2002, 01/05/2007, 02/09/2007, 12/14/2007, 02/13/2009, 12/31/2009, 01/08/2012, 12/01/2018, 12/28/2020   MMR 04/10/2002, 04/30/2005   Meningococcal Conjugate 10/25/2012, 05/11/2017   Pneumococcal Conjugate-13 06/10/2001, 08/15/2001   Tdap 10/25/2012   Varicella 04/10/2002, 05/07/2011    HPI Jon Adams is 22 y.o. male who presents today with a medical history as above. he is being seen in follow-up after he was seen in consultation for abnormal thyroid function tests requested by Babs Sciara, MD.  Patient is accompanied by his mother to clinic.    History is obtained directly from the family as well as his chart review.   He has no new complaints today.  He is not on any antithyroid intervention at this time. His previsit labs were reviewed with him-see below.  Patient  denies any palpitations, tremors, nor heat intolerance.  His previsit thyroid uptake and scan was unremarkable-see below.      He denies sleep disturbance.  He has no family history of thyroid dysfunction or thyroid malignancy.  One of his grandparents might have thyroid dysfunction.  -He is not currently on antithyroid medications nor thyroid hormone supplements. -he denies any headaches nor visual field deficits.  Review of Systems  Constitutional: + steady weight gain of 9 pounds since October 2024.      Objective:       02/11/2023     8:56 AM 12/09/2022    3:25 PM 12/08/2022   10:23 AM  Vitals with BMI  Height 5\' 11"  5\' 11"    Weight 130 lbs 127 lbs 6 oz   BMI 18.14 17.78   Systolic 104 104 132  Diastolic 74 78 61  Pulse 68 60 80    BP 104/74   Pulse 68   Ht 5\' 11"  (1.803 m)   Wt 130 lb (59 kg)   BMI 18.13 kg/m   Wt Readings from Last 3 Encounters:  02/11/23 130 lb (59 kg)  12/09/22 127 lb 6.4 oz (57.8 kg)  12/08/22 221 lb (100.2 kg)  Physical Exam  Constitutional:  Body mass index is 18.13 kg/m.,  not in acute distress, normal state of mind Eyes: PERRLA, EOMI, no exophthalmos ENT: moist mucous membranes, no gross thyromegaly, no gross cervical lymphadenopathy   CMP ( most recent) CMP     Component Value Date/Time   NA 143 09/25/2022 1640   K 4.4 09/25/2022 1640   CL 103 09/25/2022 1640   CO2 26 09/25/2022 1640   GLUCOSE 91 09/25/2022 1640   BUN 10 09/25/2022 1640   CREATININE 1.15 09/25/2022 1640   CALCIUM 10.0 09/25/2022 1640   PROT 7.0 09/25/2022 1640   ALBUMIN 5.0 09/25/2022 1640   AST 19 09/25/2022 1640   ALT 21 09/25/2022 1640   ALKPHOS 62 09/25/2022 1640   BILITOT 2.8 (H) 09/25/2022 1640   EGFR 93 09/25/2022 1640   GFRNONAA CANCELED 05/15/2014 1203     Lab Results  Component Value Date   TSH 3.670 02/01/2023   TSH 2.570 10/13/2022   TSH 3.660 09/25/2022   TSH 2.530 08/10/2022   TSH 1.890 06/08/2014   FREET4 1.78 (H) 02/01/2023   FREET4 1.82 (H) 10/13/2022   FREET4 1.85 (H) 09/25/2022   FREET4 1.76 (H) 05/15/2014     Thyroid uptake and scan findings on November 04, 2022 FINDINGS: 4 hour I-123 uptake = 9.5% (normal 5-20%),  24 hour I-123 uptake = 25.0% (normal 10-30%)   Thyroid imaging demonstrates homogeneous thyroid activity bilaterally. No focal hot or cold nodules are identified.   IMPRESSION: Normal thyroid uptake and scan.   Assessment & Plan:   1.  Abnormal thyroid labs:   - I have reviewed his  new and available thyroid records and clinically evaluated  the patient. - Based on these reviews, he has elevated free T4 with inappropriately normal TSH on his most recent thyroid function tests,  however, previously.  High TSH of 5.2 associated with high free T4 of 1.76.   His previsit thyroid uptake and scan is unremarkable-25% uptake in 24 hours with no focal abnormalities. His previsit labs continue to show slightly above target free T4, normal TSH, and a new finding with elevated  r T3. For unclear reasons, alpha subunit was not measured.  This workup so far indicates a rare possibility of resistance to thyroid hormone  (RTH). -The confirmation for this diagnosis is genetic testing looking for any defective mutation in the subunits of thyroid hormone receptor.  The family is not interested to seek genetic testing at this time. He is not symptomatic.  More importantly, there is no need for directed antithyroid intervention and it typical RTH.   -He will continue to need periodic monitoring of thyroid function, next test would be 6 months from now.  -I will request the alpha subunit again, if he presents with elevated alpha subunit, he will be considered for military/sella MRI to rule out even more rare differential diagnosis of TSH producing pituitary adenoma.  - he is advised to maintain close follow up with Babs Sciara, MD for primary care needs.  I spent  26  minutes in the care of the patient today including review of labs from Thyroid Function, CMP, and other relevant labs ; imaging/biopsy records (current and previous including abstractions from other facilities); face-to-face time discussing  his lab results and symptoms, medications doses, his options of short and long term treatment based on the latest standards of care / guidelines;   and documenting the encounter.  [name]  participated in the discussions, expressed  understanding, and voiced agreement with the above plans.  All questions were answered to his satisfaction. he is  encouraged to contact clinic should he have any questions or concerns prior to his return visit.    Follow up plan: Return in about 6 months (around 08/12/2023) for F/U with Pre-visit Labs.   Marquis Lunch, MD [facility] [address] [place], [place] [address] Phone: [phone]  Fax: [phone]     02/11/2023, 11:22 AM  This note was partially dictated with voice recognition software. Similar sounding words can be transcribed inadequately or may not  be corrected upon review.

## 2023-02-14 LAB — LIPID PANEL
Chol/HDL Ratio: 2.8 {ratio} (ref 0.0–5.0)
Cholesterol, Total: 149 mg/dL (ref 100–199)
HDL: 53 mg/dL (ref >39)
LDL Chol Calc (NIH): 80 mg/dL (ref 0–99)
Triglycerides: 85 mg/dL (ref 0–149)
VLDL Cholesterol Cal: 16 mg/dL (ref 5–40)

## 2023-02-14 LAB — T3, FREE: T3, Free: 3.6 pg/mL (ref 2.0–4.4)

## 2023-02-14 LAB — VITAMIN B12: Vitamin B-12: 441 pg/mL (ref 232–1245)

## 2023-02-14 LAB — THYROGLOBULIN LEVEL: Thyroglobulin (TG-RIA): 7.2 ng/mL

## 2023-02-14 LAB — TSH: TSH: 3.67 u[IU]/mL (ref 0.450–4.500)

## 2023-02-14 LAB — T4, FREE: Free T4: 1.78 ng/dL — ABNORMAL HIGH (ref 0.82–1.77)

## 2023-02-14 LAB — THYROID PEROXIDASE ANTIBODY: Thyroperoxidase Ab SerPl-aCnc: <9 [IU]/mL (ref 0–34)

## 2023-02-14 LAB — THYROGLOBULIN ANTIBODY: Thyroglobulin Antibody: <1 [IU]/mL (ref 0.0–0.9)

## 2023-02-14 LAB — T3, REVERSE: Reverse T3, Serum: 29.4 ng/dL — ABNORMAL HIGH (ref 9.2–24.1)

## 2023-03-11 ENCOUNTER — Ambulatory Visit (INDEPENDENT_AMBULATORY_CARE_PROVIDER_SITE_OTHER): Payer: 59 | Admitting: Family Medicine

## 2023-03-11 VITALS — BP 111/74 | HR 72 | Temp 97.9°F | Ht 71.0 in | Wt 129.0 lb

## 2023-03-11 DIAGNOSIS — J069 Acute upper respiratory infection, unspecified: Secondary | ICD-10-CM | POA: Diagnosis not present

## 2023-03-11 DIAGNOSIS — F840 Autistic disorder: Secondary | ICD-10-CM | POA: Diagnosis not present

## 2023-03-11 DIAGNOSIS — F419 Anxiety disorder, unspecified: Secondary | ICD-10-CM

## 2023-03-11 NOTE — Progress Notes (Signed)
   Subjective:    Patient ID: Jon Adams, male    DOB: [birth year], 23 y.o.   MRN: [id number]  Discussed the use of AI scribe software for clinical note transcription with the patient, who gave verbal consent to proceed.  History of Present Illness   The patient, with a history of thyroid  hormone resistance, has been feeling generally well with good appetite, although he reports occasional 'bad days.' He has been maintaining his weight and has not experienced any significant drop. He recently contracted a cold, which has somewhat affected his appetite and overall well-being.  The patient has been undergoing various tests under the care of an endocrinologist, Dr. Mckinley.  The patient also reports that he has been coping well with stress and anxiety, and has not needed to take medication for these issues recently.  The patient has recently contracted a cold, which he is managing at home.  The patient has also recently completed a healthcare power of attorney, indicating his preference for medical decision-making should he become unable to do so himself.         Review of Systems     Objective:    Physical Exam   HEENT: Ears normal. Throat normal. CHEST: Lungs clear to auscultation. CARDIOVASCULAR: Heart sounds normal.     Gen-NAD not toxic TMS-normal bilateral T- normal no redness Chest-CTA respiratory rate normal no crackles CV RRR no murmur Skin-warm dry Neuro-grossly normal       Assessment & Plan:  Assessment and Plan    Thyroid  Hormone Resistance Discussed potential genetic testing to confirm diagnosis. Patient prefers to monitor condition without pursuing genetic testing at this time. -Continue monitoring thyroid  hormone levels as per endocrinologist's recommendations.  Upper Respiratory Infection Likely viral etiology. No signs of pneumonia on examination. -Advise patient to monitor symptoms and contact office if symptoms worsen or persist beyond 7  days.  Healthcare Power of Attorney Discussed the importance of having a healthcare power of attorney and the patient has completed this with family. -Scan and add a copy of the healthcare power of attorney to the patient's medical record.  Follow-up in June 2025.      His weight is stable appetite doing well continue current measures Anxiety under decent control not utilizing medication currently

## 2023-03-17 ENCOUNTER — Ambulatory Visit (INDEPENDENT_AMBULATORY_CARE_PROVIDER_SITE_OTHER): Payer: 59 | Admitting: Family

## 2023-03-22 ENCOUNTER — Telehealth (HOSPITAL_COMMUNITY): Payer: Self-pay

## 2023-03-22 NOTE — Telephone Encounter (Signed)
03/24/23 appt confirmed by mom

## 2023-03-24 ENCOUNTER — Ambulatory Visit (HOSPITAL_COMMUNITY): Payer: 59 | Admitting: Clinical

## 2023-03-28 NOTE — Progress Notes (Unsigned)
Jon Adams   MRN:  [id number]  Mar 11, 2000   Provider: Elveria Rising NP-C Location of Care: [facility]  Visit type: Return visit  Last visit: 12/29/2021  Referral source: Babs Sciara, MD History from: Epic chart, patient and his mother  Brief history:  Copied from previous record: History of autism, ligamentous laxity and insomnia. Clonidine has worked well for helping him to get to sleep at night. He also has problems with appetite and sensory issues with food, and has received feeding therapy. He has graduated from his home school program.    Today's concerns: Mom reports that Jon Adams has lost weight over the last year. He has a fairly good appetite. 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  Jon Adams continues to have problems with initiating sleep. He takes Clonidine which helps to get him to sleep without side effects.  Jon Adams has been otherwise generally healthy since he was last seen. No health concerns today other than previously mentioned.  Review of systems: Please see HPI for neurologic and other pertinent review of systems. Otherwise all other systems were reviewed and were negative.  Problem List: Patient Active Problem List   Diagnosis Date Noted   Abnormal thyroid blood test 02/11/2023   Endocrine disorder, unspecified 11/12/2022   Hyperthyroidism 10/29/2022   Diarrhea of presumed infectious origin 02/27/2022   Heartburn 01/29/2016   Anxiety 08/27/2014   Insomnia 10/24/2013   Atopic rhinitis 05/31/2012   Autism 05/31/2012     Past Medical History:  Diagnosis Date   Autism    Development delay    Learning disabilities     Past medical history comments: See HPI  Surgical history: Past Surgical History:  Procedure Laterality Date   BIOPSY  12/08/2022   Procedure: BIOPSY;  Surgeon: Lanelle Bal, DO;  [facility];  Service: Endoscopy;;   CIRCUMCISION  01-01-2002   COLONOSCOPY     DENTAL SURGERY     ESOPHAGOGASTRODUODENOSCOPY (EGD) WITH PROPOFOL N/A 12/08/2022   Procedure: ESOPHAGOGASTRODUODENOSCOPY (EGD) WITH PROPOFOL;  Surgeon: Lanelle Bal, DO;  [facility];  Service: Endoscopy;  Laterality: N/A;  945am, asa 2   UPPER GASTROINTESTINAL ENDOSCOPY     WISDOM TOOTH EXTRACTION  2024     Family history: family history includes Anxiety disorder in his mother and sister; Asthma in his sister; Depression in his mother; Diabetes in his maternal grandfather and paternal grandmother; Heart attack in his paternal grandfather; Hyperlipidemia in his maternal grandfather and paternal grandmother; Hypertension in his maternal grandfather and paternal grandmother.   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    Jon Adams enjoys Journalist, newspaper, old money, computers, and playing on his phone.  He lives with his parents and siblings.   Social Drivers of Corporate investment banker Strain: Low Risk  (03/11/2023)   Overall Financial Resource Strain (CARDIA)    Difficulty of Paying Living Expenses: Not hard at all  Food Insecurity: No Food Insecurity (03/11/2023)   Hunger Vital Sign    Worried About Running Out of Food in the Last Year: Never true    Ran Out of Food in the Last Year: Never true  Transportation Needs: No Transportation Needs (03/11/2023)    PRAPARE - Administrator, Civil Service (Medical): No    Lack of Transportation (Non-Medical): No  Physical Activity: Unknown (03/11/2023)   Exercise Vital Sign    Days of Exercise per Week: 0 days    Minutes of Exercise per Session: Not on file  Stress: No Stress Concern Present (03/11/2023)   [name]    Feeling of Stress : Not at all  Social Connections: Socially Isolated (03/11/2023)   Social Connection and Isolation Panel [NHANES]    Frequency of Communication with Friends and Family: Never    Frequency of Social Gatherings with Friends and Family: Once a week    Attends Religious Services: More than 4 times per year    Active Member of [employer] or Organizations: No    Attends Engineer, structural: Not on file    Marital Status: Never married  Intimate Partner Violence: Not on file    Past/failed meds:  Allergies: Allergies  Allergen Reactions   Other     Cat dander- eyes watery and swollen; runny nose    Immunizations: Immunization History  Administered Date(s) Administered   DTaP 06/10/2001, 08/15/2001, 10/17/2001, 10/09/2002, 04/30/2005   HIB (PRP-OMP) 06/10/2001, 08/15/2001, 10/17/2001, 04/10/2002   Hepatitis A 05/07/2011, 06/07/2012   Hepatitis B 04-01-01, 06/10/2001, 10/17/2001   IPV 06/10/2001, 08/15/2001, 10/09/2002, 04/30/2005   Influenza Split 12/30/2012   Influenza, Seasonal, Injecte, Preservative Fre 11/15/2015   Influenza,inj,Quad PF,6+ Mos 11/30/2013, 11/14/2014, 12/25/2016   Influenza-Unspecified 10/17/2001, 04/10/2002, 01/05/2007, 02/09/2007, 12/14/2007, 02/13/2009, 12/31/2009, 01/08/2012, 12/01/2018, 12/28/2020   MMR 04/10/2002, 04/30/2005   Meningococcal Conjugate 10/25/2012, 05/11/2017   Pneumococcal Conjugate-13 06/10/2001, 08/15/2001   Tdap 10/25/2012   Varicella 04/10/2002, 05/07/2011    Diagnostics/Screenings:  Physical Exam: BP 118/74   Pulse 80   Ht 5\' 9"   (1.753 m)   Wt 128 lb 6.4 oz (58.2 kg)   BMI 18.96 kg/m  Wt Readings from Last 3 Encounters:  03/29/23 128 lb 6.4 oz (58.2 kg)  03/11/23 129 lb (58.5 kg)  02/11/23 130 lb (59 kg)     General: Well developed, well nourished young man, seated on exam table, in no evident distress Head: Head normocephalic and atraumatic.  Oropharynx benign. Neck: Supple Cardiovascular: Regular rate and rhythm, no murmurs Respiratory: Breath sounds clear to auscultation Musculoskeletal: No obvious deformities or scoliosis Skin: No rashes or neurocutaneous lesions  Neurologic Exam Mental Status: Awake and fully alert.  Oriented to place and time. Fund of knowledge subnormal for age.  Mood and affect appropriate. Cranial Nerves: Fundoscopic exam reveals sharp disc margins.  Pupils equal, briskly reactive to light.  Extraocular movements full without nystagmus. Hearing intact and symmetric to whisper.  Facial sensation intact.  Face tongue, palate move normally and symmetrically. Shoulder shrug normal Motor: Normal bulk and tone. Normal strength in all tested extremity muscles. Sensory: Intact to touch and temperature in all extremities.  Coordination: Rapid alternating movements normal in all  extremities.  Finger-to-nose and heel-to shin performed accurately bilaterally.  Romberg negative. Gait and Station: Arises from chair without difficulty.  Stance is normal. Gait demonstrates normal stride length and balance.   Able to heel, toe and tandem walk without difficulty.  Impression: Autism  Insomnia, unspecified type  Weight loss, non-intentional   Recommendations for plan of care: The patient's previous Epic records were reviewed. No recent diagnostic studies to be reviewed with the patient. I talked with Marlene Bast and his mother about his weight loss and gave Mom a sample of Duocal to try 1 scoop 2-3 into food 2-3 times per day. I asked Mom to let me know how this worked for him.  Plan until next  visit: Continue medications as prescribed  Follow up with endocrinology as scheduled Call for questions or concerns Return in about 1 year (around 03/28/2024).  The medication list was reviewed and reconciled. No changes were made in the prescribed medications today. A complete medication list was provided to the patient.  Allergies as of 03/29/2023       Reactions   Other    Cat dander- eyes watery and swollen; runny nose        Medication List        Accurate as of March 29, 2023 11:15 AM. If you have any questions, ask your nurse or doctor.          busPIRone 10 MG tablet Commonly known as: BUSPAR BuSpar 10 mg Directions 1 taken 3 times daily as needed #90 3 refills   cloNIDine 0.1 MG tablet Commonly known as: CATAPRES TAKE (1) TABLET BY MOUTH AT BEDTIME.   ondansetron 8 MG tablet Commonly known as: Zofran Take 1 tablet (8 mg total) by mouth every 8 (eight) hours as needed for nausea or vomiting.   pantoprazole 40 MG tablet Commonly known as: PROTONIX TAKE ONE TABLET BY MOUTH ONCE DAILY.      Total time spent with the patient was 25 minutes, of which 50% or more was spent in counseling and coordination of care.  Elveria Rising NP-C [facility] [address] [place] [address] [place], [place] [address] Ph. [phone] Fax [phone]

## 2023-03-29 ENCOUNTER — Ambulatory Visit (INDEPENDENT_AMBULATORY_CARE_PROVIDER_SITE_OTHER): Payer: 59 | Admitting: Family

## 2023-03-29 ENCOUNTER — Encounter (INDEPENDENT_AMBULATORY_CARE_PROVIDER_SITE_OTHER): Payer: Self-pay | Admitting: Family

## 2023-03-29 VITALS — BP 118/74 | HR 80 | Ht 69.0 in | Wt 128.4 lb

## 2023-03-29 DIAGNOSIS — G4700 Insomnia, unspecified: Secondary | ICD-10-CM | POA: Diagnosis not present

## 2023-03-29 DIAGNOSIS — R634 Abnormal weight loss: Secondary | ICD-10-CM

## 2023-03-29 DIAGNOSIS — F840 Autistic disorder: Secondary | ICD-10-CM | POA: Diagnosis not present

## 2023-03-29 NOTE — Patient Instructions (Signed)
It was a pleasure to see you today!  Instructions for you until your next appointment are as follows: Continue taking the Clonidine as prescribed for sleep Try Duocal 1 scoop 2-3 times in food for extra calories Let me know how the Duocal works for you. Please sign up for MyChart if you have not done so. Please plan to return for follow up in 1 year  or sooner if needed.  Feel free to contact our office during normal business hours at [phone] with questions or concerns. If there is no answer or the call is outside business hours, please leave a message and our clinic staff will call you back within the next business day.  If you have an urgent concern, please stay on the line for our after-hours answering service and ask for the on-call neurologist.     I also encourage you to use MyChart to communicate with me more directly. If you have not yet signed up for MyChart within [facility], the front desk staff can help you. However, please note that this inbox is NOT monitored on nights or weekends, and response can take up to 2 business days.  Urgent matters should be discussed with the on-call pediatric neurologist.   [facility], we are committed to providing exceptional care. You will receive a patient satisfaction survey through text or email regarding your visit today. Your opinion is important to me. Comments are appreciated.

## 2023-04-22 ENCOUNTER — Other Ambulatory Visit (INDEPENDENT_AMBULATORY_CARE_PROVIDER_SITE_OTHER): Payer: Self-pay | Admitting: Family

## 2023-04-22 DIAGNOSIS — G4700 Insomnia, unspecified: Secondary | ICD-10-CM

## 2023-06-07 ENCOUNTER — Telehealth (HOSPITAL_COMMUNITY): Payer: Self-pay

## 2023-06-07 NOTE — Telephone Encounter (Signed)
 06/09/23 appt confirmed by pt's mom

## 2023-06-09 ENCOUNTER — Ambulatory Visit (INDEPENDENT_AMBULATORY_CARE_PROVIDER_SITE_OTHER): Payer: 59 | Admitting: Clinical

## 2023-06-09 ENCOUNTER — Encounter (HOSPITAL_COMMUNITY): Payer: Self-pay

## 2023-06-09 DIAGNOSIS — F411 Generalized anxiety disorder: Secondary | ICD-10-CM | POA: Diagnosis not present

## 2023-06-09 NOTE — Progress Notes (Signed)
 IN PERSON   I connected with [name] on 06/09/23 at 10:00 AM EDT in person and verified that I am speaking with the correct person using two identifiers.  Location: Patient: office Provider: office   I discussed the limitations of evaluation and management by telemedicine and the availability of in person appointments. The patient expressed understanding and agreed to proceed. ( IN PERSON)    Comprehensive Clinical Assessment (CCA) Note  06/09/2023 Jon Adams [id number]  Chief Complaint:  Difficulty with anxiety as well as indication of (pseudodysphagia) impacting the ability to maintain healthy weight. Visit Diagnosis: GAD     CCA Screening, Triage and Referral (STR)  Patient Reported Information How did you hear about [place]? No data recorded Referral name: No data recorded Referral phone number: No data recorded  Whom do you see for routine medical problems? No data recorded Practice/Facility Name: No data recorded Practice/Facility Phone Number: No data recorded Name of Contact: No data recorded Contact Number: No data recorded Contact Fax Number: No data recorded Prescriber Name: No data recorded Prescriber Address (if known): No data recorded  What Is the Reason for Your Visit/Call Today? No data recorded How Long Has This Been Causing You Problems? No data recorded What Do You Feel Would Help You the Most Today? No data recorded  Have You Recently Been in Any Inpatient Treatment (Hospital/Detox/Crisis Center/28-Day Program)? No data recorded Name/Location of Program/Hospital:No data recorded How Long Were You There? No data recorded When Were You Discharged? No data recorded  Have You Ever Received Services From [facility] Before? No data recorded Who Do You See at [facility]? No data recorded  Have You Recently Had Any Thoughts About Hurting Yourself? No data recorded Are You Planning to Commit Suicide/Harm Yourself At This time? No data  recorded  Have you Recently Had Thoughts About Hurting Someone Karolee Ohs? No data recorded Explanation: No data recorded  Have You Used Any Alcohol or Drugs in the Past 24 Hours? No data recorded How Long Ago Did You Use Drugs or Alcohol? No data recorded What Did You Use and How Much? No data recorded  Do You Currently Have a Therapist/Psychiatrist? No data recorded Name of Therapist/Psychiatrist: No data recorded  Have You Been Recently Discharged From Any Office Practice or Programs? No data recorded Explanation of Discharge From Practice/Program: No data recorded    CCA Screening Triage Referral Assessment Type of Contact: No data recorded Is this Initial or Reassessment? No data recorded Date Telepsych consult ordered in [facility]:  No data recorded Time Telepsych consult ordered in [facility]:  No data recorded  Patient Reported Information Reviewed? No data recorded Patient Left Without Being Seen? No data recorded Reason for Not Completing Assessment: No data recorded  Collateral Involvement: No data recorded  Does Patient Have a Court Appointed Legal Guardian? No data recorded Name and Contact of Legal Guardian: No data recorded If Minor and Not Living with Parent(s), Who has Custody? No data recorded Is CPS involved or ever been involved? No data recorded Is APS involved or ever been involved? No data recorded  Patient Determined To Be At Risk for Harm To Self or Others Based on Review of Patient Reported Information or Presenting Complaint? No data recorded Method: No data recorded Availability of Means: No data recorded Intent: No data recorded Notification Required: No data recorded Additional Information for Danger to Others Potential: No data recorded Additional Comments for Danger to Others Potential: No data recorded Are There Guns or  Other Weapons in Your Home? No data recorded Types of Guns/Weapons: No data recorded Are These Weapons Safely Secured?                             No data recorded Who Could Verify You Are Able To Have These Secured: No data recorded Do You Have any Outstanding Charges, Pending Court Dates, Parole/Probation? No data recorded Contacted To Inform of Risk of Harm To Self or Others: No data recorded  Location of Assessment: No data recorded  Does Patient Present under Involuntary Commitment? No data recorded IVC Papers Initial File Date: No data recorded  [place] of Residence: No data recorded  Patient Currently Receiving the Following Services: No data recorded  Determination of Need: No data recorded  Options For Referral: No data recorded    CCA Biopsychosocial Intake/Chief Complaint:  The patient was referred by his PCP Dr. Eileen Stanford for further evaluation for [facility] treatment services with indication of difficulty with Anxiety.  Current Symptoms/Problems: The patient notes difficulty with Anxiety as well as the effect of this with his weight management.   Patient Reported Schizophrenia/Schizoaffective Diagnosis in Past: No   Strengths: Music interest . video gaming.  Preferences: Video Gaming, playing music and banjo with interest in blue grass.  Abilities: Music,   Type of Services Patient Feels are Needed: The patient is currently seeing Dr. Eileen Stanford for Med Management / Individual Therapy   Initial Clinical Notes/Concerns: The patient was involved with counseling services in 2016. The patient is currently receiving Med Managment through his PCP for Anxiety currently taking Buspar. No prior hospitalizations for mental health . No current S/I or H/I . Diffciutly with weight management with low weight wanting to gain but having difficulty with eating habbits due to fear of choking.   Mental Health Symptoms Depression:  None   Duration of Depressive symptoms: na  Mania:  None   Anxiety:   Worrying; Tension; Sleep; Difficulty concentrating; Irritability; Restlessness (The patient takes prescription sleep aid/  (Clonidine))   Psychosis:  None   Duration of Psychotic symptoms: NA  Trauma:  None   Obsessions:  None   Compulsions:  None   Inattention:  None   Hyperactivity/Impulsivity:  None   Oppositional/Defiant Behaviors:  None   Emotional Irregularity:  None   Other Mood/Personality Symptoms:  No data recorded   Mental Status Exam Appearance and self-care  Stature:  Tall   Weight:  Underweight   Clothing:  Casual   Grooming:  Normal   Cosmetic use:  None   Posture/gait:  Normal   Motor activity:  Not Remarkable   Sensorium  Attention:  Normal   Concentration:  Anxiety interferes   Orientation:  X5   Recall/memory:  Normal   Affect and Mood  Affect:  Appropriate   Mood:  Anxious   Relating  Eye contact:  Normal   Facial expression:  Responsive   Attitude toward examiner:  Cooperative   Thought and Language  Speech flow: Normal (The patient notes prior involvement with ST as well as OT)   Thought content:  Appropriate to Mood and Circumstances   Preoccupation:  None   Hallucinations:  None   Organization:  Logical  Company secretary of Knowledge:  Good   Intelligence:  Average   Abstraction:  Normal   Judgement:  Fair   Reality Testing:  Realistic   Insight:  Good   Decision Making:  Normal   Social Functioning  Social Maturity:  Isolates   Social Judgement:  Normal   Stress  Stressors:  Family conflict; Grief/losses; Illness (The patient notes the recent loss of his Maternal Grandparents and Aunt.)   Coping Ability:  Normal   Skill Deficits:  None   Supports:  Friends/Service system (The patient identified his Mother and Father as his supports.)     Religion: Religion/Spirituality Are You A Religious Person?: Yes What is Your Religious Affiliation?: Baptist How Might This Affect Treatment?: Protective Factor  Leisure/Recreation: Leisure / Recreation Do You Have Hobbies?: Yes Leisure and Hobbies: Risk manager,  Playing Banjo and [employer] Music  Exercise/Diet: Exercise/Diet Do You Exercise?: No Have You Gained or Lost A Significant Amount of Weight in the Past Six Months?: No Do You Follow a Special Diet?: No Do You Have Any Trouble Sleeping?: Yes Explanation of Sleeping Difficulties: Difficulty with sleep cycle takes prescription sleep aid  ( clonidine)   CCA Employment/Education Employment/Work Situation: Employment / Work Situation Employment Situation: On disability Why is Patient on Disability: The patient was placed on disabilty for a medical diagnosis at age 23 related to his thyroid How Long has Patient Been on Disability: The patient has been on disability since age 20 Patient's Job has Been Impacted by Current Illness: No What is the Longest Time Patient has Held a Job?: NA Where was the Patient Employed at that Time?: NA Has Patient ever Been in the [employer]?: No  Education: Education Is Patient Currently Attending School?: No Last Grade Completed: 12 Name of High School: [employer] Did Garment/textile technologist From [employer]?: Yes (The patient was home schooled from the 5th grade through 12th grade.) Did You Attend College?: No Did You Attend Graduate School?: No Did You Have Any Special Interests In School?: NA Did You Have An Individualized Education Program (IIEP): No Did You Have Any Difficulty At School?: No Patient's Education Has Been Impacted by Current Illness: No   CCA Family/Childhood History Family and Relationship History: Family history Marital status: Single Are you sexually active?: No What is your sexual orientation?: Heterosexual Has your sexual activity been affected by drugs, alcohol, medication, or emotional stress?: NA Does patient have children?: No  Childhood History:  Childhood History By whom was/is the patient raised?: Both parents Description of patient's relationship with caregiver when they were a child: The patient notes having a good  relationship with his parents. Patient's description of current relationship with people who raised him/her: The patient notes having a good relationship with his parents. How were you disciplined when you got in trouble as a child/adolescent?: Grounding. Does patient have siblings?: Yes Number of Siblings: 2 Description of patient's current relationship with siblings: The patient notes he has a older brother and sister . The patient notes positive interaction with both his siblings. Did patient suffer any verbal/emotional/physical/sexual abuse as a child?: No Did patient suffer from severe childhood neglect?: No Has patient ever been sexually abused/assaulted/raped as an adolescent or adult?: No Was the patient ever a victim of a crime or a disaster?: No Witnessed domestic violence?: No Has patient been affected by domestic violence as an adult?: No  Child/Adolescent Assessment:     CCA Substance Use Alcohol/Drug Use: Alcohol / Drug Use Pain Medications: See MAR Prescriptions: See MAR Over the Counter: The patient notes that he takes Multivitamins. History of alcohol / drug use?: No history of alcohol / drug abuse Longest period of sobriety (when/how long): NA  ASAM's:  Six Dimensions of Multidimensional Assessment  Dimension 1:  Acute Intoxication and/or Withdrawal Potential:      Dimension 2:  Biomedical Conditions and Complications:      Dimension 3:  Emotional, Behavioral, or Cognitive Conditions and Complications:     Dimension 4:  Readiness to Change:     Dimension 5:  Relapse, Continued use, or Continued Problem Potential:     Dimension 6:  Recovery/Living Environment:     ASAM Severity Score:    ASAM Recommended Level of Treatment:     Substance use Disorder (SUD)    Recommendations for Services/Supports/Treatments: Recommendations for Services/Supports/Treatments Recommendations For Services/Supports/Treatments: Individual  Therapy, Medication Management  DSM5 Diagnoses: Patient Active Problem List   Diagnosis Date Noted   Weight loss, non-intentional 03/29/2023   Abnormal thyroid blood test 02/11/2023   Endocrine disorder, unspecified 11/12/2022   Hyperthyroidism 10/29/2022   Diarrhea of presumed infectious origin 02/27/2022   Heartburn 01/29/2016   Anxiety 08/27/2014   Insomnia 10/24/2013   Atopic rhinitis 05/31/2012   Autism 05/31/2012    Patient Centered Plan: Patient is on the following Treatment Plan(s):  GAD   Referrals to Alternative Service(s): Referred to Alternative Service(s):   Place:   Date:   Time:    Referred to Alternative Service(s):   Place:   Date:   Time:    Referred to Alternative Service(s):   Place:   Date:   Time:    Referred to Alternative Service(s):   Place:   Date:   Time:      Collaboration of Care: No additional collaboration for this session.   Patient/Guardian was advised Release of Information must be obtained prior to any record release in order to collaborate their care with an outside provider. Patient/Guardian was advised if they have not already done so to contact the registration department to sign all necessary forms in order for [place] to release information regarding their care.   Consent: Patient/Guardian gives verbal consent for treatment and assignment of benefits for services provided during this visit. Patient /Guardian expressed understanding and agreed to proceed.   I discussed the assessment and treatment plan with the patient. The patient was provided an opportunity to ask questions and all were answered. The patient agreed with the plan and demonstrated an understanding of the instructions.   The patient was advised to call back or seek an in-person evaluation if the symptoms worsen or if the condition fails to improve as anticipated.  I provided 45 minutes of face-to-face time during this encounter.   Winfred Burn, LCSW   06/09/2023

## 2023-07-14 ENCOUNTER — Other Ambulatory Visit: Payer: Self-pay

## 2023-07-14 ENCOUNTER — Ambulatory Visit (INDEPENDENT_AMBULATORY_CARE_PROVIDER_SITE_OTHER): Admitting: Clinical

## 2023-07-14 ENCOUNTER — Encounter: Payer: Self-pay | Admitting: Family Medicine

## 2023-07-14 DIAGNOSIS — F419 Anxiety disorder, unspecified: Secondary | ICD-10-CM

## 2023-07-14 DIAGNOSIS — F411 Generalized anxiety disorder: Secondary | ICD-10-CM

## 2023-07-14 DIAGNOSIS — F840 Autistic disorder: Secondary | ICD-10-CM

## 2023-07-14 DIAGNOSIS — R6881 Early satiety: Secondary | ICD-10-CM

## 2023-07-14 NOTE — Progress Notes (Signed)
 Virtual Visit via Video Note  I connected with [name] on 07/14/23 at 10:00 AM EDT by a video enabled telemedicine application and verified that I am speaking with the correct person using two identifiers.  Location: Patient: home Provider: office   I discussed the limitations of evaluation and management by telemedicine and the availability of in person appointments. The patient expressed understanding and agreed to proceed.   THERAPIST PROGRESS NOTE   Session Time: 10:00AM-10:40AM   Participation Level: Active   Behavioral Response: CasualAlert and Anxieous   Type of Therapy: Individual Therapy   Treatment Goals addressed: Anger and Coping   Interventions: CBT, Strength-based and Coping for Anxiety   Summary: Jon Adams is a 23 y.o. male who presents with GAD and ASD. The OPT therapist worked with the patient for his ongoing OPT session. The OPT therapist utilized Motivational Interviewing to assist in creating therapeutic repore. The patient in the session was engaged and work in collaboration giving feedback about his triggers and symptoms over the past few weeks. The patient spoke about his anxiety and the connection of fear of choking on food .The OPT therapist utilized Cognitive Behavioral Therapy through cognitive restructuring as including challenging automatic negative unfactual thoughts , well as worked with the patient on ongoing strategies including preparing for his week and being strategic with his time management. The patient spoke about spending time with family for the recent Mothers Day holiday and plans for upcoming beach trip for [facility] Day weekend to [facility]. The patient reported willingness to continue to do his self check-ins and use coping to balance areas of his life reviewed in session. The patient spoke about coming appointment with his PCP and potential referral to see a Nutritionist.   Suicidal/Homicidal: Nowithout intent/plan   Therapist  Response: The OPT therapist worked with the patient for the patients scheduled session. The patient was engaged in his session and gave feedback in relation to triggers, symptoms, and behavior responses over the past few weeks. The patient spoke about his anxiety level over the course of the past few weeks.The OPT therapist worked with the patient utilizing an in session Cognitive Behavioral Therapy exercise. The patient spoke about recently celebrating Mothers Day and plans for a beach trip for upcoming Memorial Day weekend. The OPT therapist worked with the patient on getting regrounded and provided and blueprint for the next few weeks to help the patient in being mindful of the impact and content of his thoughts and utilizing a change of gear strategy when he identifies negative thoughts to help reduce likelihood of negative spiraling and increased Anxiety. The patient spoke about overviewing referral for Nutritionist at upcoming PCP appointment in June.    Plan: Follow up 2/3 weeks   Diagnosis:      Axis I:GAD / ASD                           Axis II: No diagnosis   I discussed the assessment and treatment plan with the patient. The patient was provided an opportunity to ask questions and all were answered. The patient agreed with the plan and demonstrated an understanding of the instructions.   The patient was advised to call back or seek an in-person evaluation if the symptoms worsen or if the condition fails to improve as anticipated.   I provided 40 minutes of non-face-to-face time during this encounter.   Secundino Dach, LCSW   07/14/2023

## 2023-07-20 LAB — T3, REVERSE: Reverse T3, Serum: 27.5 ng/dL — ABNORMAL HIGH (ref 9.2–24.1)

## 2023-07-20 LAB — T4, FREE: Free T4: 1.75 ng/dL (ref 0.82–1.77)

## 2023-07-20 LAB — TSH: TSH: 2.55 u[IU]/mL (ref 0.450–4.500)

## 2023-07-20 LAB — T3, FREE: T3, Free: 3.8 pg/mL (ref 2.0–4.4)

## 2023-08-11 ENCOUNTER — Ambulatory Visit: Payer: 59 | Admitting: Family Medicine

## 2023-08-11 VITALS — BP 114/74 | HR 65 | Temp 98.4°F | Ht 69.0 in | Wt 129.0 lb

## 2023-08-11 DIAGNOSIS — F419 Anxiety disorder, unspecified: Secondary | ICD-10-CM | POA: Diagnosis not present

## 2023-08-11 DIAGNOSIS — G4700 Insomnia, unspecified: Secondary | ICD-10-CM

## 2023-08-11 DIAGNOSIS — F840 Autistic disorder: Secondary | ICD-10-CM

## 2023-08-11 DIAGNOSIS — R12 Heartburn: Secondary | ICD-10-CM | POA: Diagnosis not present

## 2023-08-11 NOTE — Progress Notes (Addendum)
   Subjective:    Patient ID: Jon Adams, male    DOB: 14-Nov-2000, 23 y.o.   MRN: [id number]  HPI  Discussed the use of AI scribe software for clinical note transcription with the patient, who gave verbal consent to proceed.  History of Present Illness   Jon Adams is a 23 year old male who presents with difficulty swallowing certain foods.  He experiences difficulty swallowing certain foods, particularly pasta like spaghetti, whey, and burgers. He describes a sensation when swallowing, although food does not get stuck. He manages this by drinking plenty of water  and sometimes forcing himself to eat despite the discomfort. This issue sometimes affects his desire to eat during meals.  He takes clonidine  at night and uses Buspar  as needed for relaxation. Currently he has not had to take Buspar . He also takes Zofran  when nauseated. There have been no recent changes in weight, and his mood has been good. He has been resting well.  He occasionally takes an acid blocker for reflux, which occurs infrequently. There was a period where he did not need the medication at all.  He is actively involved in music, specifically playing the banjo, and takes lessons regularly. He has been learning to read tablature and sometimes plays by ear.       Review of Systems     Objective:   Physical Exam General-in no acute distress Eyes-no discharge Lungs-respiratory rate normal, CTA CV-no murmurs,RRR Extremities skin warm dry no edema Neuro grossly normal Behavior normal, alert Abdominal exam normal       Assessment & Plan:   Assessment and Plan    Gastroesophageal Reflux Disease (GERD) Occasional reflux managed with acid blocker as needed. - Continue acid blocker as needed.  Gilbert's Syndrome Intermittent jaundice with fluctuating bilirubin. Normal liver enzymes. Benign condition. - Monitor for dark urine or increased jaundice.  General Health Maintenance Mood stable, resting well.  Clonidine  at night, Buspar  as needed. Normal blood work. Encouraged balanced diet for B12. - Schedule wellness exam in six months. - Encourage balanced diet with adequate meat intake for B12.  Follow-up Upcoming occupational therapy appointment. Encouraged MyChart use for concerns. - Schedule follow-up in six months for wellness exam. - Encourage MyChart use for concerns.      1. Autism (Primary) Doing well functional learning to play banjo very nice young man here today with his mother This patient is permanently disabled Due to developmental and intellectual disabilities unable to manage his finances Unable to work or make a living His family's provide him with food preparation, washing close, general ADLs.  In addition to this they provide him with driving and overall supervision.  He is not able to live on his own.  He is dependent on his family.  This is a permanent disability. 2. Anxiety This comes intermittently currently not taking medication but has BuSpar  just in case  3. Insomnia, unspecified type Uses clonidine  at nighttime overall doing well  4. Heartburn Occasional pantoprazole  tries to eat healthy maintaining his weight  Recommend wellness in 6 months

## 2023-08-12 ENCOUNTER — Encounter: Payer: Self-pay | Admitting: "Endocrinology

## 2023-08-12 ENCOUNTER — Ambulatory Visit (INDEPENDENT_AMBULATORY_CARE_PROVIDER_SITE_OTHER): Payer: 59 | Admitting: "Endocrinology

## 2023-08-12 VITALS — BP 104/72 | HR 68 | Ht 69.0 in | Wt 128.4 lb

## 2023-08-12 DIAGNOSIS — R7989 Other specified abnormal findings of blood chemistry: Secondary | ICD-10-CM

## 2023-08-12 NOTE — Progress Notes (Signed)
 08/12/2023, 3:11 PM  Endocrinology follow-up note   Subjective:    Patient ID: Jon Adams, male    DOB: 09-27-2000, PCP Bennet Brasil, MD   Past Medical History:  Diagnosis Date   Autism    Development delay    Learning disabilities    Past Surgical History:  Procedure Laterality Date   BIOPSY  12/08/2022   Procedure: BIOPSY;  Surgeon: Vinetta Greening, DO;  Location: [facility];  Service: Endoscopy;;   CIRCUMCISION  2003   COLONOSCOPY     DENTAL SURGERY     ESOPHAGOGASTRODUODENOSCOPY (EGD) WITH PROPOFOL  N/A 12/08/2022   Procedure: ESOPHAGOGASTRODUODENOSCOPY (EGD) WITH PROPOFOL ;  Surgeon: Vinetta Greening, DO;  Location: [facility];  Service: Endoscopy;  Laterality: N/A;  945am, asa 2   UPPER GASTROINTESTINAL ENDOSCOPY     WISDOM TOOTH EXTRACTION  2024   Social History   Socioeconomic History   Marital status: Single    Spouse name: Not on file   Number of children: Not on file   Years of education: Not on file   Highest education level: 12th grade  Occupational History   Not on file  Tobacco Use   Smoking status: Never   Smokeless tobacco: Never  Substance and Sexual Activity   Alcohol use: No    Alcohol/week: 0.0 standard drinks of alcohol   Drug use: No   Sexual activity: Never  Other Topics Concern   Not on file  Social History Narrative   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    Jon Adams enjoys Journalist, newspaper, old money, computers, and playing on his phone.    He lives with his parents and siblings.   1 dog   Social Drivers of Corporate investment banker Strain: Low Risk  (03/11/2023)   Overall Financial Resource Strain (CARDIA)    Difficulty of Paying Living Expenses: Not hard at all  Food Insecurity: No Food Insecurity (03/11/2023)   Hunger Vital Sign    Worried About Running Out of Food in the Last Year: Never true    Ran Out of Food in the Last Year: Never true  Transportation Needs: No  Transportation Needs (03/11/2023)   PRAPARE - Administrator, Civil Service (Medical): No    Lack of Transportation (Non-Medical): No  Physical Activity: Unknown (03/11/2023)   Exercise Vital Sign    Days of Exercise per Week: 0 days    Minutes of Exercise per Session: Not on file  Stress: No Stress Concern Present (03/11/2023)   Jon Adams - Occupational Stress Questionnaire    Feeling of Stress : Not at all  Social Connections: Socially Isolated (03/11/2023)   Social Connection and Isolation Panel    Frequency of Communication with Friends and Family: Never    Frequency of Social Gatherings with Friends and Family: Once a week    Attends Religious Services: More than 4 times per year    Active Member of [employer] or Organizations: No    Attends Engineer, structural: Not on file    Marital Status: Never married   Family History  Problem Relation Age of Onset   Asthma Sister    Anxiety disorder  Sister    Diabetes Maternal Grandfather    Hypertension Maternal Grandfather    Hyperlipidemia Maternal Grandfather    Diabetes Paternal Grandmother    Hypertension Paternal Grandmother    Hyperlipidemia Paternal Grandmother    Heart attack Paternal Grandfather        Died at 1   Anxiety disorder Mother    Depression Mother    Outpatient Encounter Medications as of 08/12/2023  Medication Sig   busPIRone  (BUSPAR ) 10 MG tablet BuSpar  10 mg Directions 1 taken 3 times daily as needed #90 3 refills   ondansetron  (ZOFRAN ) 8 MG tablet Take 1 tablet (8 mg total) by mouth every 8 (eight) hours as needed for nausea or vomiting.   cloNIDine  (CATAPRES ) 0.1 MG tablet TAKE (1) TABLET BY MOUTH AT BEDTIME.   pantoprazole  (PROTONIX ) 40 MG tablet TAKE ONE TABLET BY MOUTH ONCE DAILY.   No facility-administered encounter medications on file as of 08/12/2023.   ALLERGIES: Allergies  Allergen Reactions   Other     Cat dander- eyes watery and swollen; runny nose     VACCINATION STATUS: Immunization History  Administered Date(s) Administered   DTaP 06/10/2001, 08/15/2001, 10/17/2001, 10/09/2002, 04/30/2005   HIB (PRP-OMP) 06/10/2001, 08/15/2001, 10/17/2001, 04/10/2002   Hepatitis A 05/07/2011, 06/07/2012   Hepatitis B 2002/02/04, 06/10/2001, 10/17/2001   IPV 06/10/2001, 08/15/2001, 10/09/2002, 04/30/2005   Influenza Split 12/30/2012   Influenza, Seasonal, Injecte, Preservative Fre 11/15/2015   Influenza,inj,Quad PF,6+ Mos 11/30/2013, 11/14/2014, 12/25/2016   Influenza-Unspecified 10/17/2001, 04/10/2002, 01/05/2007, 02/09/2007, 12/14/2007, 02/13/2009, 12/31/2009, 01/08/2012, 12/01/2018, 12/28/2020   MMR 04/10/2002, 04/30/2005   Meningococcal Conjugate 10/25/2012, 05/11/2017   Pneumococcal Conjugate-13 06/10/2001, 08/15/2001   Tdap 10/25/2012   Varicella 04/10/2002, 05/07/2011    HPI Jon Adams is 23 y.o. male who presents today with a medical history as above. he is being seen in follow-up after he was seen in consultation for abnormal thyroid  function tests requested by Bennet Brasil, MD. he was subsequently diagnosed with likely thyroid  hormone resistance, not confirmed by genetics. Patient is accompanied by his mother to clinic.    History is obtained directly from the family as well as his chart review.   -He is not on any antithyroid intervention.  He has no new complaints today.  His previsit labs were reviewed with him-see below.  Patient  denies any palpitations, tremors, nor heat intolerance.  His previsit thyroid  uptake and scan was unremarkable-see below.      He denies sleep disturbance.  He has no family history of thyroid  dysfunction or thyroid  malignancy.  One of his grandparents might have thyroid  dysfunction.  -He is not currently on antithyroid medications nor thyroid  hormone supplements. -he denies any headaches nor visual field deficits.  Review of Systems  Constitutional: + steady weight gain of 9 pounds since  October 2024.      Objective:       08/12/2023   10:00 AM 08/11/2023    1:59 PM 03/29/2023   10:27 AM  Vitals with BMI  Height 5' 9 5' 9 5' 9  Weight 128 lbs 6 oz 129 lbs 128 lbs 6 oz  BMI 18.95 19.04 18.95  Systolic 104 114 161  Diastolic 72 74 74  Pulse 68 65 80    BP 104/72   Pulse 68   Ht 5' 9 (1.753 m)   Wt 128 lb 6.4 oz (58.2 kg)   BMI 18.96 kg/m   Wt Readings from Last 3 Encounters:  08/12/23 128 lb 6.4 oz (58.2  kg)  08/11/23 129 lb (58.5 kg)  03/29/23 128 lb 6.4 oz (58.2 kg)    Physical Exam  Constitutional:  Body mass index is 18.96 kg/m.,  not in acute distress, normal state of mind Eyes: PERRLA, EOMI, no exophthalmos ENT: moist mucous membranes, no gross thyromegaly, no gross cervical lymphadenopathy   CMP ( most recent) CMP     Component Value Date/Time   NA 143 09/25/2022 1640   K 4.4 09/25/2022 1640   CL 103 09/25/2022 1640   CO2 26 09/25/2022 1640   GLUCOSE 91 09/25/2022 1640   BUN 10 09/25/2022 1640   CREATININE 1.15 09/25/2022 1640   CALCIUM 10.0 09/25/2022 1640   PROT 7.0 09/25/2022 1640   ALBUMIN 5.0 09/25/2022 1640   AST 19 09/25/2022 1640   ALT 21 09/25/2022 1640   ALKPHOS 62 09/25/2022 1640   BILITOT 2.8 (H) 09/25/2022 1640   EGFR 93 09/25/2022 1640   GFRNONAA CANCELED 05/15/2014 1203     Lab Results  Component Value Date   TSH 2.550 07/13/2023   TSH 3.670 02/01/2023   TSH 2.570 10/13/2022   TSH 3.660 09/25/2022   TSH 2.530 08/10/2022   FREET4 1.75 07/13/2023   FREET4 1.78 (H) 02/01/2023   FREET4 1.82 (H) 10/13/2022   FREET4 1.85 (H) 09/25/2022   FREET4 1.76 (H) 05/15/2014     Thyroid  uptake and scan findings on November 04, 2022 FINDINGS: 4 hour I-123 uptake = 9.5% (normal 5-20%),  24 hour I-123 uptake = 25.0% (normal 10-30%)   Thyroid  imaging demonstrates homogeneous thyroid  activity bilaterally. No focal hot or cold nodules are identified.   IMPRESSION: Normal thyroid  uptake and scan.   Assessment & Plan:    1.  Abnormal thyroid  labs:   - I have reviewed his  new and available thyroid  records and clinically evaluated the patient. - Based on these reviews, he has slightly better thyroid  profile than previous visits including normal TSH, free T4 and free T3, slightly elevated reverse T3.    his previsit thyroid  uptake and scan is unremarkable-25% uptake in 24 hours with no focal abnormalities. The alpha subunit is not measured this time also.    This workup so far indicates a rare possibility of resistance to thyroid  hormone  (RTH). -The confirmation for this diagnosis is genetic testing looking for any defective mutation in the subunits of thyroid  hormone receptor.  The family is not interested to seek genetic testing at this time. He is not symptomatic.  More importantly, there is no need for directed antithyroid intervention and it typical RTH.   -He will continue to need periodic monitoring of thyroid  function, next test would be 6 months from now.  -I will request the alpha subunit again, if he presents with elevated alpha subunit, he will be considered for military/sella MRI to rule out even more rare differential diagnosis of TSH producing pituitary adenoma.  - he is advised to maintain close follow up with Bennet Brasil, MD for primary care needs.  I spent  22  minutes in the care of the patient today including review of labs from Thyroid  Function, CMP, and other relevant labs ; imaging/biopsy records (current and previous including abstractions from other facilities); face-to-face time discussing  his lab results and symptoms, medications doses, his options of short and long term treatment based on the latest standards of care / guidelines;   and documenting the encounter.  [name]  participated in the discussions, expressed understanding, and voiced agreement with  the above plans.  All questions were answered to his satisfaction. he is encouraged to contact clinic should he have  any questions or concerns prior to his return visit.   Follow up plan: Return in about 6 months (around 02/11/2024) for F/U with Pre-visit Labs.   Kalvin Orf, MD [facility] [address] [place], [place] [address] Phone: [phone]  Fax: [phone]     08/12/2023, 3:11 PM  This note was partially dictated with voice recognition software. Similar sounding words can be transcribed inadequately or may not  be corrected upon review.

## 2023-08-16 ENCOUNTER — Ambulatory Visit (INDEPENDENT_AMBULATORY_CARE_PROVIDER_SITE_OTHER): Admitting: Clinical

## 2023-08-16 DIAGNOSIS — F411 Generalized anxiety disorder: Secondary | ICD-10-CM

## 2023-08-16 DIAGNOSIS — F840 Autistic disorder: Secondary | ICD-10-CM | POA: Diagnosis not present

## 2023-08-16 NOTE — Progress Notes (Signed)
 Virtual Visit via Video Note   I connected with [name] on 08/16/23 at 10:00 AM EDT by a video enabled telemedicine application and verified that I am speaking with the correct person using two identifiers.   Location: Patient: home Provider: office   I discussed the limitations of evaluation and management by telemedicine and the availability of in person appointments. The patient expressed understanding and agreed to proceed.     THERAPIST PROGRESS NOTE   Session Time: 10:00AM-10:30AM   Participation Level: Active   Behavioral Response: CasualAlert and Anxieous   Type of Therapy: Individual Therapy   Treatment Goals addressed: Anger and Coping   Interventions: CBT, Strength-based and Coping for Anxiety   Summary: Jon Adams is a 23 y.o. male who presents with GAD and ASD. The OPT therapist worked with the patient for his ongoing OPT session. The OPT therapist utilized Motivational Interviewing to assist in creating therapeutic repore. The patient in the session was engaged and work in collaboration giving feedback about his triggers and symptoms over the past few weeks. The patient spoke about his ongoing anxiety and the connection of fear of choking on food, however, noted he is adding a specialist from OT to assist.The OPT therapist utilized Cognitive Behavioral Therapy through cognitive restructuring as including challenging automatic negative unfactual thoughts , well as worked with the patient on ongoing strategies including preparing for his week and being strategic with his time management. The patient spoke about plans for spending time with family for the upcoming 4th of July holiday as well as plans for attending a family wedding in [place], [place]. The patient reported willingness to continue to do his self check-ins and use coping to balance areas of his life reviewed in session.    Suicidal/Homicidal: Nowithout intent/plan   Therapist Response: The OPT therapist  worked with the patient for the patients scheduled session. The patient was engaged in his session and gave feedback in relation to triggers, symptoms, and behavior responses over the past few weeks. The patient spoke about his anxiety level over the course of the past few weeks.The OPT therapist worked with the patient utilizing an in session Cognitive Behavioral Therapy exercise. The patient spoke about plans for spending time with family for the upcoming 4th of July holiday as well as plans for attending a family wedding in [place], [place] The OPT therapist worked with the patient on  a blueprint for the next few weeks to help the patient in managing his MH diagnosis including individualized coping strategy. The patient spoke about adding a specialist to help management of his trigger of the fear of choking during meals.The OPT therapist worked with the patient overviewing basic needs areas including sleep cycle, eating habits, physical exercise, and hygiene.     Plan: Follow up 2/3 weeks   Diagnosis:      Axis I:GAD / ASD                           Axis II: No diagnosis   I discussed the assessment and treatment plan with the patient. The patient was provided an opportunity to ask questions and all were answered. The patient agreed with the plan and demonstrated an understanding of the instructions.   The patient was advised to call back or seek an in-person evaluation if the symptoms worsen or if the condition fails to improve as anticipated.   I provided 30 minutes of non-face-to-face time  during this encounter.   Secundino Dach, LCSW   08/16/2023

## 2023-08-23 ENCOUNTER — Ambulatory Visit (HOSPITAL_COMMUNITY): Attending: Family Medicine | Admitting: Occupational Therapy

## 2023-08-23 ENCOUNTER — Encounter (HOSPITAL_COMMUNITY): Payer: Self-pay | Admitting: Occupational Therapy

## 2023-08-23 DIAGNOSIS — R6339 Other feeding difficulties: Secondary | ICD-10-CM | POA: Insufficient documentation

## 2023-08-23 DIAGNOSIS — F840 Autistic disorder: Secondary | ICD-10-CM | POA: Insufficient documentation

## 2023-08-23 DIAGNOSIS — F419 Anxiety disorder, unspecified: Secondary | ICD-10-CM | POA: Diagnosis not present

## 2023-08-23 NOTE — Therapy (Signed)
 OUTPATIENT OCCUPATIONAL THERAPY FEEDING EVALUATION  Patient Name: Jon Adams MRN: [id number] DOB:[birth year], 23 y.o., male Today's Date: 08/23/2023  PCP: Alphonsa Hamilton, MD REFERRING PROVIDER: Bluford Clement, DO  END OF SESSION:  OT End of Session - 08/23/23 1040     Visit Number 1    Number of Visits 5    Date for OT Re-Evaluation 09/24/23    Authorization Type U[employer]    OT Start Time 0931    OT Stop Time 1001    OT Time Calculation (min) 30 min    Activity Tolerance Patient tolerated treatment well    Behavior During Therapy [facility] for tasks assessed/performed          Past Medical History:  Diagnosis Date   Autism    Development delay    Learning disabilities    Past Surgical History:  Procedure Laterality Date   BIOPSY  12/08/2022   Procedure: BIOPSY;  Surgeon: Cindie Carlin POUR, DO;  [facility];  Service: Endoscopy;;   CIRCUMCISION  2003   COLONOSCOPY     DENTAL SURGERY     ESOPHAGOGASTRODUODENOSCOPY (EGD) WITH PROPOFOL  N/A 12/08/2022   Procedure: ESOPHAGOGASTRODUODENOSCOPY (EGD) WITH PROPOFOL ;  Surgeon: Cindie Carlin POUR, DO;  [facility];  Service: Endoscopy;  Laterality: N/A;  945am, asa 2   UPPER GASTROINTESTINAL ENDOSCOPY     WISDOM TOOTH EXTRACTION  2024   Patient Active Problem List   Diagnosis Date Noted   Weight loss, non-intentional 03/29/2023   Abnormal thyroid  blood test 02/11/2023   Endocrine disorder, unspecified 11/12/2022   Hyperthyroidism 10/29/2022   Diarrhea of presumed infectious origin 02/27/2022   Heartburn 01/29/2016   Anxiety 08/27/2014   Insomnia 10/24/2013   Atopic rhinitis 05/31/2012   Autism 05/31/2012    ONSET DATE: 2024  REFERRING DIAG:  F84.0 (ICD-10-CM) - Autism  F41.9 (ICD-10-CM) - Anxiousness    THERAPY DIAG:  Autism  Other feeding difficulties  Rationale for Evaluation and Treatment: Rehabilitation  SUBJECTIVE:   SUBJECTIVE STATEMENT: You can hear him chew, he chews so hard - per  mom Pt accompanied by: self and family member  PERTINENT HISTORY: Pt with PMH of ASD, Anxiety, and GERD. Had feeding diffiuculties in the past, addressed and improved with OT. Pt has since had a stomach bug and now has sensory difficulties/choking sensation with eating.   PRECAUTIONS: None  WEIGHT BEARING RESTRICTIONS: No  PAIN:  Are you having pain? No  FALLS: Has patient fallen in last 6 months? No  LIVING ENVIRONMENT: Lives with: lives with their family Lives in: House/apartment  PLOF: Independent  PATIENT GOALS: To eat normally again  NEXT MD VISIT: Unsure  OBJECTIVE:  Note: Objective measures were completed at Evaluation unless otherwise noted.  HAND DOMINANCE: Right  ADLs: Eating: Able to self feed independently - max difficulties with eating/swallowing due to chocking sensation.   FUNCTIONAL OUTCOME MEASURES: Feeding Packet provided, will return next session  OBSERVATIONS: Extensive discussion with pt and mom regarding his feeding/eating difficulties. He reports mainly having swallowing difficulties, feeling like he is choking. Meats appear to cause the most difficulties, however chewy foods and fatty foods also are difficult. Mom reports that pt has to chew hard all the time and for long periods of time before he will attempt to swallow due to fear. He has this sensation at every meal and often times it will cause him to stop eating which has resulted in weight loss and reflux.    TREATMENT DATE:  08/23/23 -Reviewed  safe eating habits -Provided extensive feeding packet and reviewed pages with pt to fill out before next session.                                                                                                                                PATIENT EDUCATION: Education details: Arts administrator Person educated: Patient Education method: Explanation, Facilities manager, and Handouts Education comprehension: verbalized understanding and returned  demonstration  HOME EXERCISE PROGRAM: Feeding Packet  GOALS: Goals reviewed with patient? Yes  SHORT TERM GOALS: Target date: 09/22/23  Pt will eat 90% of meals with no choking sensation.  Goal status: INITIAL  2.   Pt will complete 75% of meals, following safe eating techniques.   Goal status: INITIAL  ASSESSMENT:  CLINICAL IMPRESSION: Patient is a 23 y.o. male who was seen today for occupational therapy evaluation for feeding. He presents with complaints of eating/swallowing difficulties during every meal, resulting in weight loss and increased anxiety/fear.    PERFORMANCE DEFICITS: in functional skills including ADLs, IADLs, sensation, body mechanics, and sensory difficulties during eating, cognitive skills including problem solving and safety awareness, and psychosocial skills including coping strategies, environmental adaptation, and habits.   IMPAIRMENTS: are limiting patient from ADLs and IADLs.   COMORBIDITIES: has no other co-morbidities that affects occupational performance. Patient will benefit from skilled OT to address above impairments and improve overall function.  MODIFICATION OR ASSISTANCE TO COMPLETE EVALUATION: No modification of tasks or assist necessary to complete an evaluation.  OT OCCUPATIONAL PROFILE AND HISTORY: Detailed assessment: Review of records and additional review of physical, cognitive, psychosocial history related to current functional performance.  CLINICAL DECISION MAKING: LOW - limited treatment options, no task modification necessary  REHAB POTENTIAL: Good  EVALUATION COMPLEXITY: Low      PLAN:  OT FREQUENCY: 1x/week  OT DURATION: 4 weeks  PLANNED INTERVENTIONS: 97168 OT Re-evaluation, 97535 self care/ADL training, 02889 therapeutic exercise, 97530 therapeutic activity, 97112 neuromuscular re-education, 97140 manual therapy, coping strategies training, patient/family education, and DME and/or AE instructions  RECOMMENDED OTHER  SERVICES: Potentially speech?  CONSULTED AND AGREED WITH PLAN OF CARE: Patient and family member/caregiver  PLAN FOR NEXT SESSION: Follow up on feeding packet, bring food to assess eating.   Dayton Sherr Thelbert, OTR/L [facility] Outpatient OT [phone] Valentin Jillyn Thelbert, OT 08/23/2023, 10:42 AM

## 2023-08-24 NOTE — Addendum Note (Signed)
 Addended by: Aleen Marston E on: 08/24/2023 03:24 PM   Modules accepted: Orders

## 2023-08-31 ENCOUNTER — Ambulatory Visit (HOSPITAL_COMMUNITY): Attending: Family Medicine | Admitting: Occupational Therapy

## 2023-08-31 DIAGNOSIS — F840 Autistic disorder: Secondary | ICD-10-CM | POA: Insufficient documentation

## 2023-08-31 DIAGNOSIS — R6339 Other feeding difficulties: Secondary | ICD-10-CM | POA: Insufficient documentation

## 2023-09-01 ENCOUNTER — Encounter (HOSPITAL_COMMUNITY): Payer: Self-pay | Admitting: Occupational Therapy

## 2023-09-01 NOTE — Therapy (Signed)
 OUTPATIENT OCCUPATIONAL THERAPY FEEDING TREATMENT  Patient Name: Jon Adams MRN: [id number] DOB:May 17, 2000, 23 y.o., male Today's Date: 09/01/2023  PCP: Alphonsa Hamilton, MD REFERRING PROVIDER: Bluford Clement, DO  END OF SESSION:  OT End of Session - 09/01/23 1439     Visit Number 2    Number of Visits 5    Date for OT Re-Evaluation 09/24/23    Authorization Type 1) UHC  2) [place] Medicaid    OT Start Time 1154    OT Stop Time 1230    OT Time Calculation (min) 36 min    Activity Tolerance Patient tolerated treatment well    Behavior During Therapy WFL for tasks assessed/performed          Past Medical History:  Diagnosis Date   Autism    Development delay    Learning disabilities    Past Surgical History:  Procedure Laterality Date   BIOPSY  12/08/2022   Procedure: BIOPSY;  Surgeon: Cindie Carlin POUR, DO;  [facility];  Service: Endoscopy;;   CIRCUMCISION  2003   COLONOSCOPY     DENTAL SURGERY     ESOPHAGOGASTRODUODENOSCOPY (EGD) WITH PROPOFOL  N/A 12/08/2022   Procedure: ESOPHAGOGASTRODUODENOSCOPY (EGD) WITH PROPOFOL ;  Surgeon: Cindie Carlin POUR, DO;  [facility];  Service: Endoscopy;  Laterality: N/A;  945am, asa 2   UPPER GASTROINTESTINAL ENDOSCOPY     WISDOM TOOTH EXTRACTION  2024   Patient Active Problem List   Diagnosis Date Noted   Weight loss, non-intentional 03/29/2023   Abnormal thyroid  blood test 02/11/2023   Endocrine disorder, unspecified 11/12/2022   Hyperthyroidism 10/29/2022   Diarrhea of presumed infectious origin 02/27/2022   Heartburn 01/29/2016   Anxiety 08/27/2014   Insomnia 10/24/2013   Atopic rhinitis 05/31/2012   Autism 05/31/2012    ONSET DATE: 2024  REFERRING DIAG:  F84.0 (ICD-10-CM) - Autism  F41.9 (ICD-10-CM) - Anxiousness    THERAPY DIAG:  Autism  Other feeding difficulties  Rationale for Evaluation and Treatment: Rehabilitation  SUBJECTIVE:   SUBJECTIVE STATEMENT: S: I chew until the food is very  smooth. Pt accompanied by: self and family member  PERTINENT HISTORY: Pt with PMH of ASD, Anxiety, and GERD. Had feeding diffiuculties in the past, addressed and improved with OT. Pt has since had a stomach bug and now has sensory difficulties/choking sensation with eating.   PRECAUTIONS: None  WEIGHT BEARING RESTRICTIONS: No  PAIN:  Are you having pain? No  FALLS: Has patient fallen in last 6 months? No  PATIENT GOALS: To eat normally again  NEXT MD VISIT: Unsure  OBJECTIVE:  Note: Objective measures were completed at Evaluation unless otherwise noted.  HAND DOMINANCE: Right  ADLs: Eating: Able to self feed independently - max difficulties with eating/swallowing due to chocking sensation.   FUNCTIONAL OUTCOME MEASURES: Feeding Packet provided, will return next session  OBSERVATIONS: Extensive discussion with pt and mom regarding his feeding/eating difficulties. He reports mainly having swallowing difficulties, feeling like he is choking. Meats appear to cause the most difficulties, however chewy foods and fatty foods also are difficult. Mom reports that pt has to chew hard all the time and for long periods of time before he will attempt to swallow due to fear. He has this sensation at every meal and often times it will cause him to stop eating which has resulted in weight loss and reflux.    TREATMENT DATE:  08/31/23 -Pt attending feeding session with Mom, brought ritz crackers, tortilla chips, thin sliced mal[place],  a cheese stick, and 2 bottles of water .  -On observation, pt with excessive chewing with all foods, taking a drink mid chew before swallowing with all foods except cheese stick. See below:   -Cajun [place]: 28 chews, take a water  sip, 10 more chews, then swallow. [place] piece with skin=increased chews.  -Cheese stick: 42 chews then swallow, no water  sip required  -Ritz Cracker: Trial 1-30 chews, sip of water , 12 additional chews, swallow.     Trial 2-18 chews, sip of  water , 10 chews, swish and swallow.   -Tortilla chip: 26 chews, sip of water , 15 additional chews, swallow.  -Pt rating ease of eating as 1-cheese stick, 2- cracker, 3-cajun mal[place], and 4-tortilla chip. Pt reporting that he needs the foods to be as smooth as possible prior to swallowing-puree-to avoid the choking sensation. Noting pt chewing with moderate force while eating.  -Trialed lesser chews for easiest to chew cheese, OT stopping pt at 30 and asking if he could swallow, pt reporting he could not yet, finished chewing then swallowed.    08/23/23 -Reviewed safe eating habits -Provided extensive feeding packet and reviewed pages with pt to fill out before next session.                                                                                                                                PATIENT EDUCATION: Education details: Scientist, forensic Person educated: Patient Education method: Explanation, Demonstration, and Handouts Education comprehension: verbalized understanding and returned demonstration  HOME EXERCISE PROGRAM: Feeding Packet  GOALS: Goals reviewed with patient? Yes  SHORT TERM GOALS: Target date: 09/22/23  Pt will initiate a timely swallow with all solids without the use of liquid wash for 50% of trials over 3 consecutive sessions.   Goal status: INITIAL  2.   Pt will use a rotary chew pattern with appropriate bolus formation and timely transfer for 50% of trials over 3 consecutive sessions.    Goal status: INITIAL  3.  Pt will improve feeding and nutritional intake to a functional level, eating >50% of a standard meal, 75% of trials.     Goal status: INITIAL  4. Pt and caregiver will be educated on home strategies to utilize during meals to improve nutritional intake and reduce mealtimes to 30 minutes or less.    Goal status: INITIAL  ASSESSMENT:  CLINICAL IMPRESSION: Joedy presenting to therapy with foods from home of various textures. Arlen very  engaged during session, communicative and able to verbalize what he was feeling and when he was hesitant to swallow. Pt is sensitive to textures when about to swallow. Pt is seeing a counselor regarding anxiety around eating and fear of choking. Currently pt is chewing excessively to transition all foods into a puree, any texture he feels he cannot swallow. Uses water  as liquid wash to swish with foods to increase ability to create puree texture. Provided adult sensory profile to complete and bring back  to next appt.     PERFORMANCE DEFICITS: in functional skills including ADLs, IADLs, sensation, body mechanics, and sensory difficulties during eating, cognitive skills including problem solving and safety awareness, and psychosocial skills including coping strategies, environmental adaptation, and habits.       PLAN:  OT FREQUENCY: 1x/week  OT DURATION: 4 weeks  PLANNED INTERVENTIONS: 97168 OT Re-evaluation, 97535 self care/ADL training, 02889 therapeutic exercise, 97530 therapeutic activity, 97112 neuromuscular re-education, 97140 manual therapy, coping strategies training, patient/family education, and DME and/or AE instructions  RECOMMENDED OTHER SERVICES: Potentially speech?  CONSULTED AND AGREED WITH PLAN OF CARE: Patient and family member/caregiver  PLAN FOR NEXT SESSION: Follow up on feeding packet, bring food to assess eating.   Sonny Cory, OTR/L  [phone] 09/01/2023, 2:41 PM

## 2023-09-08 ENCOUNTER — Ambulatory Visit (HOSPITAL_COMMUNITY): Admitting: Occupational Therapy

## 2023-09-08 ENCOUNTER — Encounter (HOSPITAL_COMMUNITY): Payer: Self-pay | Admitting: Occupational Therapy

## 2023-09-08 DIAGNOSIS — F840 Autistic disorder: Secondary | ICD-10-CM

## 2023-09-08 DIAGNOSIS — R6339 Other feeding difficulties: Secondary | ICD-10-CM

## 2023-09-08 NOTE — Therapy (Signed)
 OUTPATIENT OCCUPATIONAL THERAPY FEEDING TREATMENT  Patient Name: Jon Adams MRN: [id number] DOB:02/26/2001, 23 y.o., male Today's Date: 09/08/2023  PCP: Alphonsa Hamilton, MD REFERRING PROVIDER: Bluford Clement, DO  END OF SESSION:  OT End of Session - 09/08/23 1209     Visit Number 3    Number of Visits 5    Date for OT Re-Evaluation 09/24/23    Authorization Type 1) UHC  2) [place] Medicaid    Authorization Time Period No auth for [facility]; requesting visits for HB MCD    Authorization - Visit Number 2    Authorization - Number of Visits 6    OT Start Time 1105    OT Stop Time 1148    OT Time Calculation (min) 43 min    Activity Tolerance Patient tolerated treatment well    Behavior During Therapy WFL for tasks assessed/performed          Past Medical History:  Diagnosis Date   Autism    Development delay    Learning disabilities    Past Surgical History:  Procedure Laterality Date   BIOPSY  12/08/2022   Procedure: BIOPSY;  Surgeon: Cindie Carlin POUR, DO;  [facility];  Service: Endoscopy;;   CIRCUMCISION  2003   COLONOSCOPY     DENTAL SURGERY     ESOPHAGOGASTRODUODENOSCOPY (EGD) WITH PROPOFOL  N/A 12/08/2022   Procedure: ESOPHAGOGASTRODUODENOSCOPY (EGD) WITH PROPOFOL ;  Surgeon: Cindie Carlin POUR, DO;  [facility];  Service: Endoscopy;  Laterality: N/A;  945am, asa 2   UPPER GASTROINTESTINAL ENDOSCOPY     WISDOM TOOTH EXTRACTION  2024   Patient Active Problem List   Diagnosis Date Noted   Weight loss, non-intentional 03/29/2023   Abnormal thyroid  blood test 02/11/2023   Endocrine disorder, unspecified 11/12/2022   Hyperthyroidism 10/29/2022   Diarrhea of presumed infectious origin 02/27/2022   Heartburn 01/29/2016   Anxiety 08/27/2014   Insomnia 10/24/2013   Atopic rhinitis 05/31/2012   Autism 05/31/2012    ONSET DATE: 2024  REFERRING DIAG:  F84.0 (ICD-10-CM) - Autism  F41.9 (ICD-10-CM) - Anxiousness    THERAPY DIAG:  Autism  Other feeding  difficulties  Rationale for Evaluation and Treatment: Rehabilitation  SUBJECTIVE:   SUBJECTIVE STATEMENT: S: My teeth are sensitive sometimes. Pt accompanied by: self and family member  PERTINENT HISTORY: Pt with PMH of ASD, Anxiety, and GERD. Had feeding diffiuculties in the past, addressed and improved with OT. Pt has since had a stomach bug and now has sensory difficulties/choking sensation with eating.   PRECAUTIONS: None  WEIGHT BEARING RESTRICTIONS: No  PAIN:  Are you having pain? No  FALLS: Has patient fallen in last 6 months? No  PATIENT GOALS: To eat normally again  NEXT MD VISIT: Unsure  OBJECTIVE:  Note: Objective measures were completed at Evaluation unless otherwise noted.  HAND DOMINANCE: Right  ADLs: Eating: Able to self feed independently - max difficulties with eating/swallowing due to chocking sensation.   FUNCTIONAL OUTCOME MEASURES: Feeding Packet provided, will return next session  OBSERVATIONS: Extensive discussion with pt and mom regarding his feeding/eating difficulties. He reports mainly having swallowing difficulties, feeling like he is choking. Meats appear to cause the most difficulties, however chewy foods and fatty foods also are difficult. Mom reports that pt has to chew hard all the time and for long periods of time before he will attempt to swallow due to fear. He has this sensation at every meal and often times it will cause him to stop eating  which has resulted in weight loss and reflux.    TREATMENT DATE:  09/08/23 -Pt attending feeding session with Mom, brought graham crackers, tortilla chips, thin sliced mal[place], a cheese stick, hawaiian roll, mac and cheese, and 1 bottle of water .  -Discussed feeding history-pt without significant true choking event but remembers being in [employer] when he was younger (prior to 2016) and feeling like he was going to choke if he swallowed.  -Checked tonsils today-no enlargement noted that would  cause any difficulty with swallowing sensation -Pt trialing counting to 30, taking a sip of water , then swishing a few times to swallow. See below:  -With mal[place] slice: chewing 25 times, sip of water , swish several times, then swallowed  -Hawaiian roll with mal[place] slice: chewed 30+ times, sip of water , swish and chew a few more bites, then swallow. OT   noting pt with discomfort with swallowing after swishing holding water  to mouth as if needing to take a sip but then   swallowed without additional liquid. Discussed texture of bread once chewed and how it becomes doughy. Smaller bites   easier to manage and swallow without additional chews versus big bites.   -Mac and cheese: significantly more difficulty with noodle/pasta texture. 30+ initial chews, water , then quite a few  additional chews before able to swallow.  -Discussed HEP for the week   08/31/23 -Pt attending feeding session with Mom, brought ritz crackers, tortilla chips, thin sliced mal[place], a cheese stick, and 2 bottles of water .  -On observation, pt with excessive chewing with all foods, taking a drink mid chew before swallowing with all foods except cheese stick. See below:   -Cajun [place]: 28 chews, take a water  sip, 10 more chews, then swallow. [place] piece with skin=increased chews.  -Cheese stick: 42 chews then swallow, no water  sip required  -Ritz Cracker: Trial 1-30 chews, sip of water , 12 additional chews, swallow.     Trial 2-18 chews, sip of water , 10 chews, swish and swallow.   -Tortilla chip: 26 chews, sip of water , 15 additional chews, swallow.  -Pt rating ease of eating as 1-cheese stick, 2- cracker, 3-cajun mal[place], and 4-tortilla chip. Pt reporting that he needs the foods to be as smooth as possible prior to swallowing-puree-to avoid the choking sensation. Noting pt chewing with moderate force while eating.  -Trialed lesser chews for easiest to chew cheese, OT stopping pt at 30 and asking if he could swallow, pt  reporting he could not yet, finished chewing then swallowed.    08/23/23 -Reviewed safe eating habits -Provided extensive feeding packet and reviewed pages with pt to fill out before next session.                                                                                                                                PATIENT EDUCATION: Education details: items 1-5, see below Person educated: Patient Education method: Explanation, Demonstration, and Handouts Education comprehension: verbalized understanding  and returned demonstration  HOME EXERCISE PROGRAM: Feeding Packet 09/08/23: Homework for 09/08/23 week:  1) Brush teeth every night right before bed 2) If bite is too big, clear throat versus small coughs. Clearing the throat will move any residual food more effectively than small, closed-mouth coughs.  3) Focus of the week: chew 30 times, sip of water  to swish, then swallow. Working on not chewing additionally any more than absolutely necessary 4) Family goals: during a family meal, take a bite of the same food at the same time. When family is ready to swallow-raise a hand or get Maximino's attention, see if he can swallow at the same time. It's ok to take a sip of water  and swish if needed.  5) Practice clearing your throat. Be POWERFUL!   GOALS: Goals reviewed with patient? Yes  SHORT TERM GOALS: Target date: 09/22/23  Pt will initiate a timely swallow with all solids without the use of liquid wash for 50% of trials over 3 consecutive sessions.   Goal status: IN PROGRESS   2.   Pt will use a rotary chew pattern with appropriate bolus formation and timely transfer for 50% of trials over 3 consecutive sessions.    Goal status: IN PROGRESS   3.  Pt will improve feeding and nutritional intake to a functional level, eating >50% of a standard meal, 75% of trials.     Goal status: IN PROGRESS   4. Pt and caregiver will be educated on home strategies to utilize during meals to  improve nutritional intake and reduce mealtimes to 30 minutes or less.    Goal status: IN PROGRESS   ASSESSMENT:  CLINICAL IMPRESSION: Draper presenting to therapy with foods from home of various textures. Fernandez very engaged during session, communicative and able to verbalize what he was feeling and when he was hesitant to swallow. Began to trial counting chews then reducing steps to the swallow. Kyson able to complete successfully with malawi, moderate difficulty initiating swallow with mixed texture of bread and malawi, max difficulty with mac and cheese. [place] able to determine when he is panicking and work through solving the issue, even if it's additional chews and liquid washes. Provided homework for this week.     PERFORMANCE DEFICITS: in functional skills including ADLs, IADLs, sensation, body mechanics, and sensory difficulties during eating, cognitive skills including problem solving and safety awareness, and psychosocial skills including coping strategies, environmental adaptation, and habits.       PLAN:  OT FREQUENCY: 1x/week  OT DURATION: 4 weeks  PLANNED INTERVENTIONS: 97168 OT Re-evaluation, 97535 self care/ADL training, 02889 therapeutic exercise, 97530 therapeutic activity, 97112 neuromuscular re-education, 97140 manual therapy, coping strategies training, patient/family education, and DME and/or AE instructions  RECOMMENDED OTHER SERVICES: Potentially speech?  CONSULTED AND AGREED WITH PLAN OF CARE: Patient and family member/caregiver  PLAN FOR NEXT SESSION: Follow up on sensory profile and homework, continue reducing chewing   Sonny Cory, OTR/L  [phone] 09/08/2023, 12:10 PM

## 2023-09-08 NOTE — Patient Instructions (Signed)
 Homework for 09/08/23 week:    1) Brush teeth every night right before bed  2) If bite is too big, clear throat versus small coughs. Clearing the throat will move any residual food more effectively than small, closed-mouth coughs.   3) Focus of the week: chew 30 times, sip of water  to swish, then swallow. Working on not chewing additionally any more than absolutely necessary  4) Family goals: during a family meal, take a bite of the same food at the same time. When family is ready to swallow-raise a hand or get Carlson's attention, see if he can swallow at the same time. It's ok to take a sip of water  and swish if needed.   5) Practice clearing your throat. Be POWERFUL!

## 2023-09-14 ENCOUNTER — Encounter (HOSPITAL_COMMUNITY): Admitting: Occupational Therapy

## 2023-09-16 ENCOUNTER — Ambulatory Visit: Admitting: Nurse Practitioner

## 2023-09-16 VITALS — BP 116/77 | HR 95 | Temp 98.1°F | Wt 125.6 lb

## 2023-09-16 DIAGNOSIS — R6339 Other feeding difficulties: Secondary | ICD-10-CM

## 2023-09-16 DIAGNOSIS — K219 Gastro-esophageal reflux disease without esophagitis: Secondary | ICD-10-CM | POA: Diagnosis not present

## 2023-09-16 DIAGNOSIS — R634 Abnormal weight loss: Secondary | ICD-10-CM

## 2023-09-16 DIAGNOSIS — F840 Autistic disorder: Secondary | ICD-10-CM | POA: Diagnosis not present

## 2023-09-16 MED ORDER — SERTRALINE HCL 25 MG PO TABS: 25.0000 mg | ORAL_TABLET | Freq: Every day | ORAL | 0 refills | Status: DC

## 2023-09-17 ENCOUNTER — Ambulatory Visit (HOSPITAL_COMMUNITY): Admitting: Occupational Therapy

## 2023-09-17 ENCOUNTER — Encounter (HOSPITAL_COMMUNITY): Payer: Self-pay | Admitting: Occupational Therapy

## 2023-09-17 ENCOUNTER — Encounter: Payer: Self-pay | Admitting: Nurse Practitioner

## 2023-09-17 DIAGNOSIS — F840 Autistic disorder: Secondary | ICD-10-CM

## 2023-09-17 DIAGNOSIS — R6339 Other feeding difficulties: Secondary | ICD-10-CM

## 2023-09-17 NOTE — Progress Notes (Signed)
 Subjective:    Patient ID: Jon Adams, male    DOB: [birth year], 23 y.o.   MRN: [id number]  HPI Presents with both of his parents to discuss recent unintentional weight loss.  Defers being interviewed alone.  Has a long history of eating difficulties from childhood.  Things had gotten stable for a while.  His father states that he choked on a tortilla chip a while back and that seemed to flareup a lot of issues.  In addition in February 2024 he had a bad stomach virus which again seem to exacerbate his anxiety and symptoms.  Just started working with OT regarding feeding and swallowing.  Patient describes a sensation of fear of choking or something getting stuck in his throat.  Swallows liquids without difficulty.  His parents say over chews food to the point where it is basically liquid.  His mother noted that he chewed ice cream and a milkshake recently as well.  Anything that has any consistency seems to trigger the chewing.  Was started on BuSpar  for his anxiety.  Was taking half a tab twice a day, no improvement in his anxiety.  Also seem to cause extreme drowsiness and exhaustion.  Takes clonidine  at bedtime for sleep. Has a history of acid reflux.  Does not take his pantoprazole  daily.  For the past 2 to 3 weeks his family has noticed more burping.  Also patient complaints of epigastric discomfort into the lower chest at times.  No fevers.  Has alternating cycles of constipation and diarrhea.  No blood in his stools.  No change in color. Denies tobacco use, alcohol use or excessive NSAID use. Very picky eater.  Denies early satiety.  States the problem is more up in his throat with his fear of getting something stuck while eating.    Review of Systems  Constitutional:  Positive for appetite change and unexpected weight change. Negative for fever.  HENT:  Positive for trouble swallowing.   Respiratory:  Negative for cough, chest tightness, shortness of breath and wheezing.   Cardiovascular:   Positive for chest pain.  Gastrointestinal:  Positive for abdominal pain, constipation, diarrhea and nausea. Negative for blood in stool and vomiting.      09/16/2023    3:08 PM  Depression screen PHQ 2/9  Decreased Interest 0  Down, Depressed, Hopeless 0  PHQ - 2 Score 0  Altered sleeping 0  Change in appetite 3  Feeling bad or failure about yourself  0  Trouble concentrating 0  Moving slowly or fidgety/restless 3  Suicidal thoughts 0  PHQ-9 Score 6  Difficult doing work/chores Somewhat difficult      09/16/2023    3:08 PM 08/11/2023    2:05 PM 06/09/2023   10:22 AM 03/11/2023   10:27 AM  GAD 7 : Generalized Anxiety Score  Nervous, Anxious, on Edge 3 0  1  Control/stop worrying 2 0  0  Worry too much - different things  0  0  Trouble relaxing 3 0  0  Restless 0 0  0  Easily annoyed or irritable 3 1  1   Afraid - awful might happen 2 0  0  Total GAD 7 Score  1  2  Anxiety Difficulty Very difficult Not difficult at all  Somewhat difficult     Information is confidential and restricted. Go to Review Flowsheets to unlock data.    Social History   Tobacco Use   Smoking status: Never   Smokeless tobacco: Never  Substance Use Topics   Alcohol use: No    Alcohol/week: 0.0 standard drinks of alcohol   Drug use: No        Objective:   Physical Exam Vitals and nursing note reviewed. Exam conducted with a chaperone present.  Constitutional:      General: He is not in acute distress. Cardiovascular:     Rate and Rhythm: Normal rate and regular rhythm.     Heart sounds: Normal heart sounds.  Pulmonary:     Effort: Pulmonary effort is normal.     Breath sounds: Normal breath sounds.  Abdominal:     General: Abdomen is flat. Bowel sounds are normal. There is no distension.     Palpations: Abdomen is soft. There is no mass.     Tenderness: There is abdominal tenderness. There is no guarding or rebound.     Comments: Mild upper epigastric area tenderness to palpation.   Skin:    General: Skin is warm and dry.  Neurological:     Mental Status: He is alert and oriented to person, place, and time.  Psychiatric:        Mood and Affect: Mood normal.        Behavior: Behavior normal.        Thought Content: Thought content normal.    Today's Vitals   09/16/23 1501  BP: 116/77  Pulse: 95  Temp: 98.1 F (36.7 C)  SpO2: 97%  Weight: 125 lb 9.6 oz (57 kg)   Body mass index is 18.55 kg/m.  12/08/2022: EGD performed; diagnosed with gastritis        Assessment & Plan:   Problem List Items Addressed This Visit       Other   Autism   Weight loss, non-intentional   Other Visit Diagnoses       Gastroesophageal reflux disease without esophagitis    -  Primary     Feeding difficulty in adult          Meds ordered this encounter  Medications   sertraline  (ZOLOFT ) 25 MG tablet    Sig: Take 1 tablet (25 mg total) by mouth daily.    Dispense:  30 tablet    Refill:  0    Supervising Provider:   ALPHONSA HAMILTON A [[id number]]   Start pantoprazole  daily. Continue follow-up with OT to help with eating difficulties. Stop BuSpar . Start sertraline  low-dose as directed with plans to titrate if needed and tolerated.  Reviewed potential adverse effects.  Discontinue medication and contact office if any problems. Return in about 1 month (around 10/17/2023). Call back sooner if needed.

## 2023-09-17 NOTE — Therapy (Signed)
 OUTPATIENT OCCUPATIONAL THERAPY FEEDING TREATMENT  Patient Name: Jon Adams MRN: [id number] DOB:08-03-2000, 23 y.o., male Today's Date: 09/17/2023  PCP: Alphonsa Hamilton, MD REFERRING PROVIDER: Bluford Clement, DO  END OF SESSION:  OT End of Session - 09/17/23 1111     Visit Number 4    Number of Visits 5    Date for OT Re-Evaluation 09/24/23    Authorization Type 1) [employer]  2) [place] Medicaid    Authorization Time Period No auth for [facility]; requesting visits for [place] [facility]    Authorization - Visit Number 3    Authorization - Number of Visits 6    OT Start Time 1016    OT Stop Time 1056    OT Time Calculation (min) 40 min    Activity Tolerance Patient tolerated treatment well    Behavior During Therapy WFL for tasks assessed/performed           Past Medical History:  Diagnosis Date   Autism    Development delay    Learning disabilities    Past Surgical History:  Procedure Laterality Date   BIOPSY  12/08/2022   Procedure: BIOPSY;  Surgeon: Cindie Carlin POUR, DO;  Location: [facility];  Service: Endoscopy;;   CIRCUMCISION  2003   COLONOSCOPY     DENTAL SURGERY     ESOPHAGOGASTRODUODENOSCOPY (EGD) WITH PROPOFOL  N/A 12/08/2022   Procedure: ESOPHAGOGASTRODUODENOSCOPY (EGD) WITH PROPOFOL ;  Surgeon: Cindie Carlin POUR, DO;  Location: [facility];  Service: Endoscopy;  Laterality: N/A;  945am, asa 2   UPPER GASTROINTESTINAL ENDOSCOPY     WISDOM TOOTH EXTRACTION  2024   Patient Active Problem List   Diagnosis Date Noted   Weight loss, non-intentional 03/29/2023   Abnormal thyroid  blood test 02/11/2023   Endocrine disorder, unspecified 11/12/2022   Hyperthyroidism 10/29/2022   Diarrhea of presumed infectious origin 02/27/2022   Heartburn 01/29/2016   Anxiety 08/27/2014   Insomnia 10/24/2013   Atopic rhinitis 05/31/2012   Autism 05/31/2012    ONSET DATE: 2024  REFERRING DIAG:  F84.0 (ICD-10-CM) - Autism  F41.9 (ICD-10-CM) - Anxiousness    THERAPY DIAG:  Autism  Other  feeding difficulties  Rationale for Evaluation and Treatment: Rehabilitation  SUBJECTIVE:   SUBJECTIVE STATEMENT: S: It's been a rough week. Pt accompanied by: self and family member  PERTINENT HISTORY: Pt with PMH of ASD, Anxiety, and GERD. Had feeding diffiuculties in the past, addressed and improved with OT. Pt has since had a stomach bug and now has sensory difficulties/choking sensation with eating.   PRECAUTIONS: None  WEIGHT BEARING RESTRICTIONS: No  PAIN:  Are you having pain? No  FALLS: Has patient fallen in last 6 months? No  PATIENT GOALS: To eat normally again  NEXT MD VISIT: Unsure  OBJECTIVE:  Note: Objective measures were completed at Evaluation unless otherwise noted.  HAND DOMINANCE: Right  ADLs: Eating: Able to self feed independently - max difficulties with eating/swallowing due to chocking sensation.   FUNCTIONAL OUTCOME MEASURES: Feeding Packet provided, will return next session  OBSERVATIONS: Extensive discussion with pt and mom regarding his feeding/eating difficulties. He reports mainly having swallowing difficulties, feeling like he is choking. Meats appear to cause the most difficulties, however chewy foods and fatty foods also are difficult. Mom reports that pt has to chew hard all the time and for long periods of time before he will attempt to swallow due to fear. He has this sensation at every meal and often times it will cause him to stop  eating which has resulted in weight loss and reflux.    TREATMENT DATE:  09/17/23 -Pt attending feeding session with Mom, brought breakfast crisp (mixed texture), mac and cheese, tortilla chips, and graham crackers along with a bottle of water . Pt reporting a rough week, has lost 5-7 lbs recently and was unable to finish a burger or burrito this week due to panicking.  -Pt reports he went to the MD and has been placed on anti-anxiety medication. Sees a counselor once a month but has not been given any home  strategies or techniques. Discussed requesting recommendations from MD for a referral to a dietician and discussed requesting a new counselor as Mom does not feel who he is with is the right fit.  -Pt trialing breakfast crisp first today, using strategy of taking a liquid wash sooner at 15-20 bites, then chewing some more and swallowing. OT noting increased time and need for second wash, eyes red at the rims upon swallowing. Pt verbalizing need to take a second wash to get the bolus to a puree form. Transitioned to graham crackers with improvement in swallow after first wash, as graham crackers dissolve more than the crisp. Transitioned to tortilla chip with increased chewing and time from first bite to swallow. Pt rating level of difficulty from easiest to hardest crisp->graham cracker->torilla chip   09/08/23 -Pt attending feeding session with Mom, brought graham crackers, tortilla chips, thin sliced malawi, a cheese stick, hawaiian roll, mac and cheese, and 1 bottle of water .  -Discussed feeding history-pt without significant true choking event but remembers being in [employer] when he was younger (prior to 2016) and feeling like he was going to choke if he swallowed.  -Checked tonsils today-no enlargement noted that would cause any difficulty with swallowing sensation -Pt trialing counting to 30, taking a sip of water , then swishing a few times to swallow. See below:  -With malawi slice: chewing 25 times, sip of water , swish several times, then swallowed  -Hawaiian roll with malawi slice: chewed 30+ times, sip of water , swish and chew a few more bites, then swallow. OT   noting pt with discomfort with swallowing after swishing holding water  to mouth as if needing to take a sip but then   swallowed without additional liquid. Discussed texture of bread once chewed and how it becomes doughy. Smaller bites   easier to manage and swallow without additional chews versus big bites.   -Mac and cheese:  significantly more difficulty with noodle/pasta texture. 30+ initial chews, water , then quite a few  additional chews before able to swallow.  -Discussed HEP for the week   08/31/23 -Pt attending feeding session with Mom, brought ritz crackers, tortilla chips, thin sliced malawi, a cheese stick, and 2 bottles of water .  -On observation, pt with excessive chewing with all foods, taking a drink mid chew before swallowing with all foods except cheese stick. See below:   -Cajun Malawi: 28 chews, take a water  sip, 10 more chews, then swallow. [place] piece with skin=increased chews.  -Cheese stick: 42 chews then swallow, no water  sip required  -Ritz Cracker: Trial 1-30 chews, sip of water , 12 additional chews, swallow.     Trial 2-18 chews, sip of water , 10 chews, swish and swallow.   -Tortilla chip: 26 chews, sip of water , 15 additional chews, swallow.  -Pt rating ease of eating as 1-cheese stick, 2- cracker, 3-cajun malawi, and 4-tortilla chip. Pt reporting that he needs the foods to be as smooth as possible prior  to swallowing-puree-to avoid the choking sensation. Noting pt chewing with moderate force while eating.  -Trialed lesser chews for easiest to chew cheese, OT stopping pt at 30 and asking if he could swallow, pt reporting he could not yet, finished chewing then swallowed.                                                                                                                           PATIENT EDUCATION: Education details: see below for current week Person educated: Patient Education method: Explanation, Demonstration, and Handouts Education comprehension: verbalized understanding and returned demonstration  HOME EXERCISE PROGRAM: 09/17/23:  Homework for week of 7/18: -Talk to MD about referral to dietician.  -Talk to MD about counselor concerns and finding a counselor who can see weekly -Research protein smoothie recipes  -Trial small bites of fresh peach and cooked apple to  determine texture tolerance -Continue prior weeks homework tasks  09/08/23: Homework for 09/08/23 week:  1) Brush teeth every night right before bed 2) If bite is too big, clear throat versus small coughs. Clearing the throat will move any residual food more effectively than small, closed-mouth coughs.  3) Focus of the week: chew 30 times, sip of water  to swish, then swallow. Working on not chewing additionally any more than absolutely necessary 4) Family goals: during a family meal, take a bite of the same food at the same time. When family is ready to swallow-raise a hand or get Navarre's attention, see if he can swallow at the same time. It's ok to take a sip of water  and swish if needed.  5) Practice clearing your throat. Be POWERFUL!    GOALS: Goals reviewed with patient? Yes  SHORT TERM GOALS: Target date: 09/22/23  Pt will initiate a timely swallow with all solids without the use of liquid wash for 50% of trials over 3 consecutive sessions.   Goal status: IN PROGRESS   2.   Pt will use a rotary chew pattern with appropriate bolus formation and timely transfer for 50% of trials over 3 consecutive sessions.    Goal status: IN PROGRESS   3.  Pt will improve feeding and nutritional intake to a functional level, eating >50% of a standard meal, 75% of trials.     Goal status: IN PROGRESS   4. Pt and caregiver will be educated on home strategies to utilize during meals to improve nutritional intake and reduce mealtimes to 30 minutes or less.    Goal status: IN PROGRESS   ASSESSMENT:  CLINICAL IMPRESSION: Murl presenting to therapy with foods from home of various textures. Keymari and Mom reporting this has been a really rough week for [employer] with increased anxiety around food and eating. OT noting increased chewing and liquid washes, unable to swallow with any texture in the bolus and noting mod to max effort to swallow with residual redness around the eyes after swallow. Extensive  discussion around anxiety and counseling, also recommended referral to  dietician considering Linsey is losing weight. For this week-goal is feeling safe and reducing anxiety around eating. Provide home strategies to try and homework for finding protein smoothies or shakes that he likes.     PERFORMANCE DEFICITS: in functional skills including ADLs, IADLs, sensation, body mechanics, and sensory difficulties during eating, cognitive skills including problem solving and safety awareness, and psychosocial skills including coping strategies, environmental adaptation, and habits.       PLAN:  OT FREQUENCY: 1x/week  OT DURATION: 4 weeks  PLANNED INTERVENTIONS: 97168 OT Re-evaluation, 97535 self care/ADL training, 02889 therapeutic exercise, 97530 therapeutic activity, 97112 neuromuscular re-education, 97140 manual therapy, coping strategies training, patient/family education, and DME and/or AE instructions  RECOMMENDED OTHER SERVICES: Potentially speech?  CONSULTED AND AGREED WITH PLAN OF CARE: Patient and family member/caregiver  PLAN FOR NEXT SESSION: Follow up on MD referrals to dietician and alternate counselor   Sonny Cory, OTR/L  [phone] 09/17/2023, 11:12 AM

## 2023-09-17 NOTE — Patient Instructions (Signed)
 Homework for week of 7/18:  -Talk to MD about referral to dietician.  -Talk to MD about counselor concerns and finding a counselor who can see weekly -Research protein smoothie recipes  -Trial small bites of fresh peach and cooked apple to determine texture tolerance -Continue prior weeks homework tasks

## 2023-09-20 ENCOUNTER — Encounter: Payer: Self-pay | Admitting: Family Medicine

## 2023-09-21 ENCOUNTER — Ambulatory Visit (HOSPITAL_COMMUNITY): Admitting: Occupational Therapy

## 2023-09-21 ENCOUNTER — Encounter (HOSPITAL_COMMUNITY): Payer: Self-pay | Admitting: Occupational Therapy

## 2023-09-21 DIAGNOSIS — R6339 Other feeding difficulties: Secondary | ICD-10-CM

## 2023-09-21 DIAGNOSIS — F840 Autistic disorder: Secondary | ICD-10-CM | POA: Diagnosis not present

## 2023-09-21 NOTE — Telephone Encounter (Signed)
 Nurses Please go ahead and refer Andrei to nutritionist (This would likely have to be via [facility] in [place]-locally we only have the diabetic educator which is not what we are searching for)  Also please pass message along to [facility] and his mom  I am not personally familiar with any therapist that works specifically with ARFID.  But I do have a couple patients that are going to [facility] for this condition.-This is something we can look into   but [facility] does have some psychologist that work with this if they are interested we could look into that as a potential referral source  Please give mom's feedback regarding this and Arvo's  Thanks-Dr. Glendia   As for nutritional referral is for ARFID, autism, poor weight gain

## 2023-09-21 NOTE — Therapy (Signed)
 OUTPATIENT OCCUPATIONAL THERAPY FEEDING TREATMENT  Patient Name: Jon Adams MRN: [id number] DOB:[birth year], 23 y.o., male Today's Date: 09/21/2023  PCP: Alphonsa Hamilton, MD REFERRING PROVIDER: Bluford Clement, DO  END OF SESSION:  OT End of Session - 09/21/23 1348     Visit Number 5    Number of Visits 5    Date for OT Re-Evaluation 10/21/23    Authorization Type 1) [employer]  2) [place] Medicaid    Authorization Time Period No auth for [facility]; requesting visits for [facility] MCD    Authorization - Visit Number 4    Authorization - Number of Visits 6    OT Start Time 1145    OT Stop Time 1225    OT Time Calculation (min) 40 min    Activity Tolerance Patient tolerated treatment well    Behavior During Therapy WFL for tasks assessed/performed            Past Medical History:  Diagnosis Date   Autism    Development delay    Learning disabilities    Past Surgical History:  Procedure Laterality Date   BIOPSY  12/08/2022   Procedure: BIOPSY;  Surgeon: Cindie Carlin POUR, DO;  Location: [facility];  Service: Endoscopy;;   CIRCUMCISION  2003   COLONOSCOPY     DENTAL SURGERY     ESOPHAGOGASTRODUODENOSCOPY (EGD) WITH PROPOFOL  N/A 12/08/2022   Procedure: ESOPHAGOGASTRODUODENOSCOPY (EGD) WITH PROPOFOL ;  Surgeon: Cindie Carlin POUR, DO;  Location: [facility];  Service: Endoscopy;  Laterality: N/A;  945am, asa 2   UPPER GASTROINTESTINAL ENDOSCOPY     WISDOM TOOTH EXTRACTION  2024   Patient Active Problem List   Diagnosis Date Noted   Weight loss, non-intentional 03/29/2023   Abnormal thyroid  blood test 02/11/2023   Endocrine disorder, unspecified 11/12/2022   Hyperthyroidism 10/29/2022   Diarrhea of presumed infectious origin 02/27/2022   Heartburn 01/29/2016   Anxiety 08/27/2014   Insomnia 10/24/2013   Atopic rhinitis 05/31/2012   Autism 05/31/2012    ONSET DATE: 2024  REFERRING DIAG:  F84.0 (ICD-10-CM) - Autism  F41.9 (ICD-10-CM) - Anxiousness    THERAPY DIAG:  Autism  Other  feeding difficulties  Rationale for Evaluation and Treatment: Rehabilitation  SUBJECTIVE:   SUBJECTIVE STATEMENT: S: It's been a rough week. Pt accompanied by: self and family member  PERTINENT HISTORY: Pt with PMH of ASD, Anxiety, and GERD. Had feeding diffiuculties in the past, addressed and improved with OT. Pt has since had a stomach bug and now has sensory difficulties/choking sensation with eating.   PRECAUTIONS: None  WEIGHT BEARING RESTRICTIONS: No  PAIN:  Are you having pain? No  FALLS: Has patient fallen in last 6 months? No  PATIENT GOALS: To eat normally again  NEXT MD VISIT: Unsure  OBJECTIVE:  Note: Objective measures were completed at Evaluation unless otherwise noted.  HAND DOMINANCE: Right  ADLs: Eating: Able to self feed independently - max difficulties with eating/swallowing due to chocking sensation.   FUNCTIONAL OUTCOME MEASURES: Feeding Packet provided, will return next session  OBSERVATIONS: Extensive discussion with pt and mom regarding his feeding/eating difficulties. He reports mainly having swallowing difficulties, feeling like he is choking. Meats appear to cause the most difficulties, however chewy foods and fatty foods also are difficult. Mom reports that pt has to chew hard all the time and for long periods of time before he will attempt to swallow due to fear. He has this sensation at every meal and often times it will cause him to  stop eating which has resulted in weight loss and reflux.    TREATMENT DATE:  09/21/23 -Pt attending feeding session with Mom, brought [place] and cheese rolls, peaches, tortilla chips, cheese puffs, and graham crackers along with a bottle of water . Pt reporting a difficult week, continues to have high anxiety and panics. Tried to eat scrambled eggs and bacon last night, began with bacon but when he got to the eggs he was not able to chew them up enough to swallow. Ended up stopping the attempt to eat.  -Mom  reports they have messaged the MD about getting into with a counselor who has experience with ARFID as well as referring to nutritionist.  -Pt choosing to try turkey/cheese roll first. OT noting increased chewing before and after liquid wash. Pt reporting the need for foods to be completely smooth before he feels he can swallow safely.  -Pt attempting multiple challenges including various timing for washes, OT cuing for when to pause and attempt a swallow. Pt rating trialed foods from easiest to hardest as peaches->cheese puffs->turkey/cheese rolls.  -Challenged pt to stop after first set of chews and see if he could initiate a swallow, pt able to swallow cheese puffs and mango crisps without a liquid wash. Even took multiple bites of mango crisp without a liquid wash.  -Pt unable to initiate a swallow without continued chewing after liquid wash for all other textures, citing anxiety/panic due to bits and pieces in the bolus.   09/17/23 -Pt attending feeding session with Mom, brought breakfast crisp (mixed texture), mac and cheese, tortilla chips, and graham crackers along with a bottle of water . Pt reporting a rough week, has lost 5-7 lbs recently and was unable to finish a burger or burrito this week due to panicking.  -Pt reports he went to the MD and has been placed on anti-anxiety medication. Sees a counselor once a month but has not been given any home strategies or techniques. Discussed requesting recommendations from MD for a referral to a dietician and discussed requesting a new counselor as Mom does not feel who he is with is the right fit.  -Pt trialing breakfast crisp first today, using strategy of taking a liquid wash sooner at 15-20 bites, then chewing some more and swallowing. OT noting increased time and need for second wash, eyes red at the rims upon swallowing. Pt verbalizing need to take a second wash to get the bolus to a puree form. Transitioned to graham crackers with improvement in  swallow after first wash, as graham crackers dissolve more than the crisp. Transitioned to tortilla chip with increased chewing and time from first bite to swallow. Pt rating level of difficulty from easiest to hardest crisp->graham cracker->torilla chip  09/08/23 -Pt attending feeding session with Mom, brought graham crackers, tortilla chips, thin sliced [place], a cheese stick, hawaiian roll, mac and cheese, and 1 bottle of water .  -Discussed feeding history-pt without significant true choking event but remembers being in [employer] when he was younger (prior to 2016) and feeling like he was going to choke if he swallowed.  -Checked tonsils today-no enlargement noted that would cause any difficulty with swallowing sensation -Pt trialing counting to 30, taking a sip of water , then swishing a few times to swallow. See below:  -With mal[place] slice: chewing 25 times, sip of water , swish several times, then swallowed  -Hawaiian roll with mal[place] slice: chewed 30+ times, sip of water , swish and chew a few more bites, then swallow. OT  noting pt with discomfort with swallowing after swishing holding water  to mouth as if needing to take a sip but then   swallowed without additional liquid. Discussed texture of bread once chewed and how it becomes doughy. Smaller bites   easier to manage and swallow without additional chews versus big bites.   -Mac and cheese: significantly more difficulty with noodle/pasta texture. 30+ initial chews, water , then quite a few  additional chews before able to swallow.  -Discussed HEP for the week                                                                                                                          PATIENT EDUCATION: Education details: see below for current week Person educated: Patient Education method: Explanation, Demonstration, and Handouts Education comprehension: verbalized understanding and returned demonstration  HOME EXERCISE  PROGRAM: 09/21/23:  Homework for week of 7/22:  -Self-affirmation before swallowing I can do it -Calming activity for 10 minutes before a meal -Challenge yourself to swallow easy foods without a wash or without chewing to puree (cheese puffs, ritz crackers, mango crisps, peaches)  09/17/23:  Homework for week of 7/18: -Talk to MD about referral to dietician.  -Talk to MD about counselor concerns and finding a counselor who can see weekly -Research protein smoothie recipes  -Trial small bites of fresh peach and cooked apple to determine texture tolerance -Continue prior weeks homework tasks  09/08/23: Homework for 09/08/23 week:  1) Brush teeth every night right before bed 2) If bite is too big, clear throat versus small coughs. Clearing the throat will move any residual food more effectively than small, closed-mouth coughs.  3) Focus of the week: chew 30 times, sip of water  to swish, then swallow. Working on not chewing additionally any more than absolutely necessary 4) Family goals: during a family meal, take a bite of the same food at the same time. When family is ready to swallow-raise a hand or get Heyward's attention, see if he can swallow at the same time. It's ok to take a sip of water  and swish if needed.  5) Practice clearing your throat. Be POWERFUL!    GOALS: Goals reviewed with patient? Yes  SHORT TERM GOALS: Target date: 09/22/23  Pt will initiate a timely swallow with all solids without the use of liquid wash for 50% of trials over 3 consecutive sessions.   Goal status: IN PROGRESS   2.   Pt will use a rotary chew pattern with appropriate bolus formation and timely transfer for 50% of trials over 3 consecutive sessions.    Goal status: IN PROGRESS   3.  Pt will improve feeding and nutritional intake to a functional level, eating >50% of a standard meal, 75% of trials.     Goal status: IN PROGRESS   4. Pt and caregiver will be educated on home strategies to utilize  during meals to improve nutritional intake and reduce mealtimes to 30 minutes or less.  Goal status: IN PROGRESS   ASSESSMENT:  CLINICAL IMPRESSION: Hasani presenting to therapy with foods from home of various textures. Kennis and Mom reporting this has been another difficult week but not quite as bad as prior week. OT noting less physical symptoms such as reddened eyes when attempting swallows and less hard swallows. Pt continues to demonstrate signs of severe anxiety and need to chew excessively with all textures. Noting improvement in ability to swallow easily dissolving foods such as cheese puffs, without a liquid wash. Also reviewed self-affirmation and recommended Erich find calming activities to complete prior to mealtimes to combat building anxiety.     PERFORMANCE DEFICITS: in functional skills including ADLs, IADLs, sensation, body mechanics, and sensory difficulties during eating, cognitive skills including problem solving and safety awareness, and psychosocial skills including coping strategies, environmental adaptation, and habits.       PLAN:  OT FREQUENCY: 1x/week  OT DURATION: 4 weeks  PLANNED INTERVENTIONS: 97168 OT Re-evaluation, 97535 self care/ADL training, 02889 therapeutic exercise, 97530 therapeutic activity, 97112 neuromuscular re-education, 97140 manual therapy, coping strategies training, patient/family education, and DME and/or AE instructions  RECOMMENDED OTHER SERVICES: Potentially speech?  CONSULTED AND AGREED WITH PLAN OF CARE: Patient and family member/caregiver  PLAN FOR NEXT SESSION: Follow up on homework, continue with self-affirmation work and gradual challenges to texture   [employer], OTR/L  [phone] 09/21/2023, 1:49 PM

## 2023-09-22 ENCOUNTER — Other Ambulatory Visit: Payer: Self-pay

## 2023-09-22 DIAGNOSIS — R634 Abnormal weight loss: Secondary | ICD-10-CM

## 2023-09-22 DIAGNOSIS — F840 Autistic disorder: Secondary | ICD-10-CM

## 2023-09-23 ENCOUNTER — Ambulatory Visit (INDEPENDENT_AMBULATORY_CARE_PROVIDER_SITE_OTHER): Admitting: Clinical

## 2023-09-23 DIAGNOSIS — F411 Generalized anxiety disorder: Secondary | ICD-10-CM | POA: Diagnosis not present

## 2023-09-23 DIAGNOSIS — F840 Autistic disorder: Secondary | ICD-10-CM

## 2023-09-23 NOTE — Progress Notes (Signed)
 IN PERSON    I connected with [name] on 08/16/23 at 10:00 AM EDT in person and verified that I am speaking with the correct person using two identifiers.   Location: Patient: office  Provider: office   I discussed the limitations of evaluation and management by telemedicine and the availability of in person appointments. The patient expressed understanding and agreed to proceed.     THERAPIST PROGRESS NOTE   Session Time: 9:00 AM-9:45 AM   Participation Level: Active   Behavioral Response: CasualAlert and Anxieous   Type of Therapy: Individual Therapy   Treatment Goals addressed: Anger and Coping   Interventions: CBT, Strength-based and Coping for Anxiety   Summary: Jon Adams is a 23 y.o. male who presents with GAD and ASD. The OPT therapist worked with the patient for his ongoing OPT session. The OPT therapist utilized Motivational Interviewing to assist in creating therapeutic repore. The patient in the session was engaged and work in collaboration giving feedback about his triggers and symptoms over the past few weeks. The patient spoke about his ongoing anxiety and the connection of fear of choking on food, and spoke about the techniques from his new OT provider he is working to implement..The OPT therapist utilized Cognitive Behavioral Therapy through cognitive restructuring as including challenging automatic negative unfactual thoughts , well as worked with the patient on ongoing strategies including preparing for his week and being strategic with his time management. The patient worked with the OPT therapist sequencing upcoming transitional event in preparation for attending a family wedding in [place], [place] 10/10/2023. The patient reported willingness to continue to do his self check-ins and use coping when needed to reduce anxiety and manage anxiety episodes.   Suicidal/Homicidal: Nowithout intent/plan   Therapist Response: The OPT therapist worked with the patient  for the patients scheduled session. The patient was engaged in his session and gave feedback in relation to triggers, symptoms, and behavior responses over the past few weeks. The patient spoke about his anxiety level over the course of the past few weeks.The OPT therapist worked with the patient utilizing an in session Cognitive Behavioral Therapy exercise. The patient sequenced with the OPT upcoming transitional event to assist with preparation for a family wedding in [place], [place] The OPT therapist worked with the patient on  a blueprint for the next few weeks to help the patient in managing his MH diagnosis including individualized coping strategy. The patient spoke about adding a specialist to help management of his fear of choking via a referral through his PCP to [facility].   Plan: Follow up 2/3 weeks   Diagnosis:      Axis I:GAD / ASD                           Axis II: No diagnosis   I discussed the assessment and treatment plan with the patient. The patient was provided an opportunity to ask questions and all were answered. The patient agreed with the plan and demonstrated an understanding of the instructions.   The patient was advised to call back or seek an in-person evaluation if the symptoms worsen or if the condition fails to improve as anticipated.   I provided 45 minutes of non-face-to-face time during this encounter.   Jerel Pepper, LCSW   09/23/2023

## 2023-09-28 ENCOUNTER — Encounter: Payer: Self-pay | Admitting: Family Medicine

## 2023-09-29 ENCOUNTER — Telehealth: Payer: Self-pay

## 2023-09-29 ENCOUNTER — Ambulatory Visit (HOSPITAL_COMMUNITY): Admitting: Occupational Therapy

## 2023-09-29 ENCOUNTER — Encounter (HOSPITAL_COMMUNITY): Payer: Self-pay | Admitting: Occupational Therapy

## 2023-09-29 DIAGNOSIS — F840 Autistic disorder: Secondary | ICD-10-CM

## 2023-09-29 DIAGNOSIS — R6339 Other feeding difficulties: Secondary | ICD-10-CM

## 2023-09-29 NOTE — Therapy (Signed)
 OUTPATIENT OCCUPATIONAL THERAPY FEEDING TREATMENT  Patient Name: Jon Adams MRN: [id number] DOB:2000/05/07, 23 y.o., male Today's Date: 09/29/2023  PCP: Alphonsa Hamilton, MD REFERRING PROVIDER: Bluford Clement, DO  END OF SESSION:  OT End of Session - 09/29/23 1516     Visit Number 6    Number of Visits 6    Date for OT Re-Evaluation 10/21/23    Authorization Type 1) UHC  2) [place] Medicaid    Authorization Time Period No auth for [facility]; requesting visits for HB MCD    Authorization - Visit Number 5    Authorization - Number of Visits 6    OT Start Time 1345    OT Stop Time 1425    OT Time Calculation (min) 40 min    Activity Tolerance Patient tolerated treatment well    Behavior During Therapy WFL for tasks assessed/performed             Past Medical History:  Diagnosis Date   Autism    Development delay    Learning disabilities    Past Surgical History:  Procedure Laterality Date   BIOPSY  12/08/2022   Procedure: BIOPSY;  Surgeon: Cindie Carlin POUR, DO;  [facility];  Service: Endoscopy;;   CIRCUMCISION  2003   COLONOSCOPY     DENTAL SURGERY     ESOPHAGOGASTRODUODENOSCOPY (EGD) WITH PROPOFOL  N/A 12/08/2022   Procedure: ESOPHAGOGASTRODUODENOSCOPY (EGD) WITH PROPOFOL ;  Surgeon: Cindie Carlin POUR, DO;  [facility];  Service: Endoscopy;  Laterality: N/A;  945am, asa 2   UPPER GASTROINTESTINAL ENDOSCOPY     WISDOM TOOTH EXTRACTION  2024   Patient Active Problem List   Diagnosis Date Noted   Weight loss, non-intentional 03/29/2023   Abnormal thyroid  blood test 02/11/2023   Endocrine disorder, unspecified 11/12/2022   Hyperthyroidism 10/29/2022   Diarrhea of presumed infectious origin 02/27/2022   Heartburn 01/29/2016   Anxiety 08/27/2014   Insomnia 10/24/2013   Atopic rhinitis 05/31/2012   Autism 05/31/2012    ONSET DATE: 2024  REFERRING DIAG:  F84.0 (ICD-10-CM) - Autism  F41.9 (ICD-10-CM) - Anxiousness    THERAPY DIAG:  Autism  Other  feeding difficulties  Rationale for Evaluation and Treatment: Rehabilitation  SUBJECTIVE:   SUBJECTIVE STATEMENT: S: It's been worse but the same.  Pt accompanied by: self and family member  PERTINENT HISTORY: Pt with PMH of ASD, Anxiety, and GERD. Had feeding diffiuculties in the past, addressed and improved with OT. Pt has since had a stomach bug and now has sensory difficulties/choking sensation with eating.   PRECAUTIONS: None  WEIGHT BEARING RESTRICTIONS: No  PAIN:  Are you having pain? No  FALLS: Has patient fallen in last 6 months? No  PATIENT GOALS: To eat normally again  NEXT MD VISIT: Unsure  OBJECTIVE:  Note: Objective measures were completed at Evaluation unless otherwise noted.  HAND DOMINANCE: Right  ADLs: Eating: Able to self feed independently - max difficulties with eating/swallowing due to chocking sensation.   FUNCTIONAL OUTCOME MEASURES: Feeding Packet provided, will return next session  OBSERVATIONS: Extensive discussion with pt and mom regarding his feeding/eating difficulties. He reports mainly having swallowing difficulties, feeling like he is choking. Meats appear to cause the most difficulties, however chewy foods and fatty foods also are difficult. Mom reports that pt has to chew hard all the time and for long periods of time before he will attempt to swallow due to fear. He has this sensation at every meal and often times it will  cause him to stop eating which has resulted in weight loss and reflux.    TREATMENT DATE:  09/29/23 -Pt reports a rough week, wanted chick-fil-a one day but unable to eat. Attempted pulled bbq one day but unable to eat it. Now unable to swallow a pill due to panic. Continues to lose weight.  -Discussed need for pt to get calories and nutrition. Created a list of soft foods that pt can manage and swallow with pt and Mom. Discussed not trying to swallow without chewing excessively at this time, main goal now is calories.   -Instructed pt to eat 5-6 small meals per day, drink at least 1 protein shake/smoothie per day. Make a meal plan the day before so that pt is not panicking while trying to decide what to eat.   09/21/23 -Pt attending feeding session with Mom, brought [place] and cheese rolls, peaches, tortilla chips, cheese puffs, and graham crackers along with a bottle of water . Pt reporting a difficult week, continues to have high anxiety and panics. Tried to eat scrambled eggs and bacon last night, began with bacon but when he got to the eggs he was not able to chew them up enough to swallow. Ended up stopping the attempt to eat.  -Mom reports they have messaged the MD about getting into with a counselor who has experience with ARFID as well as referring to nutritionist.  -Pt choosing to try turkey/cheese roll first. OT noting increased chewing before and after liquid wash. Pt reporting the need for foods to be completely smooth before he feels he can swallow safely.  -Pt attempting multiple challenges including various timing for washes, OT cuing for when to pause and attempt a swallow. Pt rating trialed foods from easiest to hardest as peaches->cheese puffs->turkey/cheese rolls.  -Challenged pt to stop after first set of chews and see if he could initiate a swallow, pt able to swallow cheese puffs and mango crisps without a liquid wash. Even took multiple bites of mango crisp without a liquid wash.  -Pt unable to initiate a swallow without continued chewing after liquid wash for all other textures, citing anxiety/panic due to bits and pieces in the bolus.   09/17/23 -Pt attending feeding session with Mom, brought breakfast crisp (mixed texture), mac and cheese, tortilla chips, and graham crackers along with a bottle of water . Pt reporting a rough week, has lost 5-7 lbs recently and was unable to finish a burger or burrito this week due to panicking.  -Pt reports he went to the MD and has been placed on anti-anxiety  medication. Sees a counselor once a month but has not been given any home strategies or techniques. Discussed requesting recommendations from MD for a referral to a dietician and discussed requesting a new counselor as Mom does not feel who he is with is the right fit.  -Pt trialing breakfast crisp first today, using strategy of taking a liquid wash sooner at 15-20 bites, then chewing some more and swallowing. OT noting increased time and need for second wash, eyes red at the rims upon swallowing. Pt verbalizing need to take a second wash to get the bolus to a puree form. Transitioned to graham crackers with improvement in swallow after first wash, as graham crackers dissolve more than the crisp. Transitioned to tortilla chip with increased chewing and time from first bite to swallow. Pt rating level of difficulty from easiest to hardest crisp->graham cracker->torilla chip  PATIENT EDUCATION: Education details: see below for current week Person educated: Patient Education method: Explanation, Demonstration, and Handouts Education comprehension: verbalized understanding and returned demonstration  HOME EXERCISE PROGRAM: 09/29/23: Homework for week of 7/30:  -Eat 5-6 small meals/day -Drink at least 1 protein shake/smoothie per day -Make meal plan for the next day  09/21/23:  Homework for week of 7/22:  -Self-affirmation before swallowing I can do it -Calming activity for 10 minutes before a meal -Challenge yourself to swallow easy foods without a wash or without chewing to puree (cheese puffs, ritz crackers, mango crisps, peaches)  09/17/23:  Homework for week of 7/18: -Talk to MD about referral to dietician.  -Talk to MD about counselor concerns and finding a counselor who can see weekly -Research protein smoothie recipes  -Trial small bites of fresh peach and cooked apple to determine texture tolerance -Continue prior weeks  homework tasks  09/08/23: Homework for 09/08/23 week:  1) Brush teeth every night right before bed 2) If bite is too big, clear throat versus small coughs. Clearing the throat will move any residual food more effectively than small, closed-mouth coughs.  3) Focus of the week: chew 30 times, sip of water  to swish, then swallow. Working on not chewing additionally any more than absolutely necessary 4) Family goals: during a family meal, take a bite of the same food at the same time. When family is ready to swallow-raise a hand or get Jamond's attention, see if he can swallow at the same time. It's ok to take a sip of water  and swish if needed.  5) Practice clearing your throat. Be POWERFUL!    GOALS: Goals reviewed with patient? Yes  SHORT TERM GOALS: Target date: 09/22/23  Pt will initiate a timely swallow with all solids without the use of liquid wash for 50% of trials over 3 consecutive sessions.   Goal status: IN PROGRESS   2.   Pt will use a rotary chew pattern with appropriate bolus formation and timely transfer for 50% of trials over 3 consecutive sessions.    Goal status: IN PROGRESS   3.  Pt will improve feeding and nutritional intake to a functional level, eating >50% of a standard meal, 75% of trials.     Goal status: IN PROGRESS   4. Pt and caregiver will be educated on home strategies to utilize during meals to improve nutritional intake and reduce mealtimes to 30 minutes or less.    Goal status: IN PROGRESS   ASSESSMENT:  CLINICAL IMPRESSION: Juanantonio presenting to therapy with foods from home of various textures. Earnestine and Mom reporting this has been another difficult week as Jaquavius continues to have panic attacks and is getting tension headaches when trying to eat foods that are difficult for him. Scheduled for nutrition/dietician appt on 8/26, having difficulty finding ARFID therapist. OT to research and communicate with doctor.    PERFORMANCE DEFICITS: in functional skills  including ADLs, IADLs, sensation, body mechanics, and sensory difficulties during eating, cognitive skills including problem solving and safety awareness, and psychosocial skills including coping strategies, environmental adaptation, and habits.       PLAN:  OT FREQUENCY: 1x/week  OT DURATION: 4 weeks  PLANNED INTERVENTIONS: 97168 OT Re-evaluation, 97535 self care/ADL training, 02889 therapeutic exercise, 97530 therapeutic activity, 97112 neuromuscular re-education, 97140 manual therapy, coping strategies training, patient/family education, and DME and/or AE instructions  RECOMMENDED OTHER SERVICES: Potentially speech?  CONSULTED AND AGREED WITH PLAN OF CARE: Patient and family member/caregiver  PLAN FOR  NEXT SESSION: Follow up on homework, continue with self-affirmation work and gradual challenges to texture   Sonny Cory, OTR/L  [phone] 09/29/2023, 3:16 PM

## 2023-09-29 NOTE — Telephone Encounter (Signed)
 Pantoprazole   Jakota is having hard time swollen these pills  can we get this into liquid please.

## 2023-10-01 ENCOUNTER — Other Ambulatory Visit: Payer: Self-pay | Admitting: Family Medicine

## 2023-10-01 ENCOUNTER — Other Ambulatory Visit: Payer: Self-pay

## 2023-10-01 DIAGNOSIS — F5082 Avoidant/restrictive food intake disorder: Secondary | ICD-10-CM

## 2023-10-01 NOTE — Telephone Encounter (Signed)
 Pharmacy stated this is a enteric coated time released medication that can not be crushed- patient would have to be switched to omeprazole  capsule-the only one that can be opened and sprinkled in food

## 2023-10-01 NOTE — Telephone Encounter (Signed)
 Nurses To my knowledge I am not aware of any liquid version of this Most insurance companies will not cover liquid versions because they have to be compounded every 2 weeks Please touch base with pharmacist at [facility] to see if they have suggestions Also please find out from pharmacist if the medication can be pulverized by family then mixed into applesauce or yogurt?

## 2023-10-01 NOTE — Telephone Encounter (Signed)
 Nurses Please go ahead with referral to [facility] Specifically there is a Veterinary surgeon by the name of [name] She works with individuals with ARFID  Welcome has underlying diagnosis of autism as well as ARFID  Please see if our referral team can get him an appointment with this particular psychologist-I do not know if she has aged restrictions because typically she works with younger individuals.  If referral team run into the age restrictions-in other words not seeing him because of his age-please let me know  Thanks-Dr. Glendia  You can let Jon Adams and his mother Jon Adams know that we have initiated the referral

## 2023-10-01 NOTE — Telephone Encounter (Signed)
 I recommend omeprazole  20 mg daily This comes in a capsule may be sprinkled on his food Please explain to the mother they do not think the liquid form of this medicine or the other medicine Omeprazole  is in the capsule that can be opened up and sprinkled on on food  May send in omeprazole  20 mg, #30, 1 daily, 3 refills discontinue pantoprazole 

## 2023-10-04 ENCOUNTER — Other Ambulatory Visit: Payer: Self-pay

## 2023-10-04 MED ORDER — OMEPRAZOLE 20 MG PO CPDR: 20.0000 mg | DELAYED_RELEASE_CAPSULE | Freq: Every day | ORAL | 3 refills | Status: DC

## 2023-10-05 ENCOUNTER — Ambulatory Visit (HOSPITAL_COMMUNITY): Attending: Family Medicine | Admitting: Occupational Therapy

## 2023-10-05 ENCOUNTER — Encounter (HOSPITAL_COMMUNITY): Payer: Self-pay | Admitting: Occupational Therapy

## 2023-10-05 DIAGNOSIS — R6339 Other feeding difficulties: Secondary | ICD-10-CM | POA: Diagnosis present

## 2023-10-05 DIAGNOSIS — F840 Autistic disorder: Secondary | ICD-10-CM | POA: Diagnosis present

## 2023-10-05 NOTE — Therapy (Signed)
 OUTPATIENT OCCUPATIONAL THERAPY FEEDING TREATMENT  Patient Name: Jon Adams MRN: [id number] DOB:2000/08/08, 23 y.o., male Today's Date: 10/05/2023  PCP: Alphonsa Hamilton, MD REFERRING PROVIDER: Bluford Clement, DO  END OF SESSION:  OT End of Session - 10/05/23 1325     Visit Number 7    Number of Visits 7    Date for OT Re-Evaluation 10/21/23    Authorization Type 1) UHC  2) [place] Medicaid    Authorization Time Period No auth for [facility]; requesting visits for HB MCD    Authorization - Visit Number 6    Authorization - Number of Visits 6    OT Start Time 1148    OT Stop Time 1228    OT Time Calculation (min) 40 min    Activity Tolerance Patient tolerated treatment well    Behavior During Therapy [facility] for tasks assessed/performed             Past Medical History:  Diagnosis Date   Autism    Development delay    Learning disabilities    Past Surgical History:  Procedure Laterality Date   BIOPSY  12/08/2022   Procedure: BIOPSY;  Surgeon: Cindie Carlin POUR, DO;  [facility];  Service: Endoscopy;;   CIRCUMCISION  2003   COLONOSCOPY     DENTAL SURGERY     ESOPHAGOGASTRODUODENOSCOPY (EGD) WITH PROPOFOL  N/A 12/08/2022   Procedure: ESOPHAGOGASTRODUODENOSCOPY (EGD) WITH PROPOFOL ;  Surgeon: Cindie Carlin POUR, DO;  [facility];  Service: Endoscopy;  Laterality: N/A;  945am, asa 2   UPPER GASTROINTESTINAL ENDOSCOPY     WISDOM TOOTH EXTRACTION  2024   Patient Active Problem List   Diagnosis Date Noted   Weight loss, non-intentional 03/29/2023   Abnormal thyroid  blood test 02/11/2023   Endocrine disorder, unspecified 11/12/2022   Hyperthyroidism 10/29/2022   Diarrhea of presumed infectious origin 02/27/2022   Heartburn 01/29/2016   Anxiety 08/27/2014   Insomnia 10/24/2013   Atopic rhinitis 05/31/2012   Autism 05/31/2012    ONSET DATE: 2024  REFERRING DIAG:  F84.0 (ICD-10-CM) - Autism  F41.9 (ICD-10-CM) - Anxiousness    THERAPY DIAG:  Autism  Other  feeding difficulties  Rationale for Evaluation and Treatment: Rehabilitation  SUBJECTIVE:   SUBJECTIVE STATEMENT: S: It's been worse but the same.  Pt accompanied by: self and family member  PERTINENT HISTORY: Pt with PMH of ASD, Anxiety, and GERD. Had feeding diffiuculties in the past, addressed and improved with OT. Pt has since had a stomach bug and now has sensory difficulties/choking sensation with eating.   PRECAUTIONS: None  WEIGHT BEARING RESTRICTIONS: No  PAIN:  Are you having pain? No  FALLS: Has patient fallen in last 6 months? No  PATIENT GOALS: To eat normally again  NEXT MD VISIT: Unsure  OBJECTIVE:  Note: Objective measures were completed at Evaluation unless otherwise noted.  HAND DOMINANCE: Right  ADLs: Eating: Able to self feed independently - max difficulties with eating/swallowing due to chocking sensation.   FUNCTIONAL OUTCOME MEASURES: Feeding Packet provided, will return next session  OBSERVATIONS: Extensive discussion with pt and mom regarding his feeding/eating difficulties. He reports mainly having swallowing difficulties, feeling like he is choking. Meats appear to cause the most difficulties, however chewy foods and fatty foods also are difficult. Mom reports that pt has to chew hard all the time and for long periods of time before he will attempt to swallow due to fear. He has this sensation at every meal and often times it will  cause him to stop eating which has resulted in weight loss and reflux.    TREATMENT DATE:  10/05/23 -Pt reports an improved week this week, has been focusing on soft foods and has been eating more and with less stress.  -Brought new food today-dates! Pt reports he really likes the taste and was able to eat one in 2 bites, continues to have excessive chewing today between biting and swallowing along with liquid wash. Pt brought tortilla chips, mini-cheese ritz, breakfast bar, and graham crackers. Trialing at least 1-2  bites of each food item and finishing a breakfast bar.  -Pt is currently eating about 3 meals per day, encouraged adding a snack mid-afternoon. Reports he found a yogurt brand, Oui, that is very filling and he really likes it.  -Reminded that the goal now is calories and not worry about swallowing larger pieces of food.   09/29/23 -Pt reports a rough week, wanted chick-fil-a one day but unable to eat. Attempted pulled bbq one day but unable to eat it. Now unable to swallow a pill due to panic. Continues to lose weight.  -Discussed need for pt to get calories and nutrition. Created a list of soft foods that pt can manage and swallow with pt and Mom. Discussed not trying to swallow without chewing excessively at this time, main goal now is calories.  -Instructed pt to eat 5-6 small meals per day, drink at least 1 protein shake/smoothie per day. Make a meal plan the day before so that pt is not panicking while trying to decide what to eat.   09/21/23 -Pt attending feeding session with Mom, brought [place] and cheese rolls, peaches, tortilla chips, cheese puffs, and graham crackers along with a bottle of water . Pt reporting a difficult week, continues to have high anxiety and panics. Tried to eat scrambled eggs and bacon last night, began with bacon but when he got to the eggs he was not able to chew them up enough to swallow. Ended up stopping the attempt to eat.  -Mom reports they have messaged the MD about getting into with a counselor who has experience with ARFID as well as referring to nutritionist.  -Pt choosing to try turkey/cheese roll first. OT noting increased chewing before and after liquid wash. Pt reporting the need for foods to be completely smooth before he feels he can swallow safely.  -Pt attempting multiple challenges including various timing for washes, OT cuing for when to pause and attempt a swallow. Pt rating trialed foods from easiest to hardest as peaches->cheese puffs->turkey/cheese  rolls.  -Challenged pt to stop after first set of chews and see if he could initiate a swallow, pt able to swallow cheese puffs and mango crisps without a liquid wash. Even took multiple bites of mango crisp without a liquid wash.  -Pt unable to initiate a swallow without continued chewing after liquid wash for all other textures, citing anxiety/panic due to bits and pieces in the bolus.    PATIENT EDUCATION: Education details: add a snack in the afternoons Person educated: Patient Education method: Explanation, Demonstration, and Handouts Education comprehension: verbalized understanding and returned demonstration  HOME EXERCISE PROGRAM: 10/05/23: add a snack in the afternoons  09/29/23: Homework for week of 7/30:  -Eat 5-6 small meals/day -Drink at least 1 protein shake/smoothie per day -Make meal plan for the next day  09/21/23:  Homework for week of 7/22:  -Self-affirmation before swallowing I can do it -Calming activity for 10 minutes before a meal -Challenge  yourself to swallow easy foods without a wash or without chewing to puree (cheese puffs, ritz crackers, mango crisps, peaches)  09/17/23:  Homework for week of 7/18: -Talk to MD about referral to dietician.  -Talk to MD about counselor concerns and finding a counselor who can see weekly -Research protein smoothie recipes  -Trial small bites of fresh peach and cooked apple to determine texture tolerance -Continue prior weeks homework tasks  09/08/23: Homework for 09/08/23 week:  1) Brush teeth every night right before bed 2) If bite is too big, clear throat versus small coughs. Clearing the throat will move any residual food more effectively than small, closed-mouth coughs.  3) Focus of the week: chew 30 times, sip of water  to swish, then swallow. Working on not chewing additionally any more than absolutely necessary 4) Family goals: during a family meal, take a bite of the same food at the same time. When family is ready to  swallow-raise a hand or get Isaia's attention, see if he can swallow at the same time. It's ok to take a sip of water  and swish if needed.  5) Practice clearing your throat. Be POWERFUL!    GOALS: Goals reviewed with patient? Yes  SHORT TERM GOALS: Target date: 09/22/23  Pt will initiate a timely swallow with all solids without the use of liquid wash for 50% of trials over 3 consecutive sessions.   Goal status: IN PROGRESS   2.   Pt will use a rotary chew pattern with appropriate bolus formation and timely transfer for 50% of trials over 3 consecutive sessions.    Goal status: IN PROGRESS   3.  Pt will improve feeding and nutritional intake to a functional level, eating >50% of a standard meal, 75% of trials.     Goal status: IN PROGRESS   4. Pt and caregiver will be educated on home strategies to utilize during meals to improve nutritional intake and reduce mealtimes to 30 minutes or less.    Goal status: IN PROGRESS   ASSESSMENT:  CLINICAL IMPRESSION: Tarren presenting to therapy with foods from home of various textures, new food today-dates. Reese and Mom reporting this has been a better week in terms of stress and panic at meals. Wymon has been focusing on softer foods and has been eating more and taking in more calories. Provided Mom with information for [facility] and [facility] in [place] to research.    PERFORMANCE DEFICITS: in functional skills including ADLs, IADLs, sensation, body mechanics, and sensory difficulties during eating, cognitive skills including problem solving and safety awareness, and psychosocial skills including coping strategies, environmental adaptation, and habits.       PLAN:  OT FREQUENCY: 1x/week  OT DURATION: 4 weeks  PLANNED INTERVENTIONS: 97168 OT Re-evaluation, 97535 self care/ADL training, 02889 therapeutic exercise, 97530 therapeutic activity, 97112 neuromuscular re-education, 97140 manual therapy, coping  strategies training, patient/family education, and DME and/or AE instructions  RECOMMENDED OTHER SERVICES: Referral to counselor/psychology/psychiatry for ARFID  CONSULTED AND AGREED WITH PLAN OF CARE: Patient and family member/caregiver  PLAN FOR NEXT SESSION: Follow up on homework, continue with self-affirmation work and gradual challenges to texture   Sonny Cory, OTR/L  [phone] 10/05/2023, 1:26 PM      Managed Medicaid Authorization Request Treatment Start Date: 2023-10-26  Visit Dx Codes: F84.0  Functional Tool Score: R63.39  For all possible CPT codes, reference the Planned Interventions line above.     Check all conditions that are expected to  impact treatment: {Conditions expected to impact treatment:None of these apply   If treatment provided at initial evaluation, no treatment charged due to lack of authorization.

## 2023-10-07 ENCOUNTER — Telehealth: Payer: Self-pay

## 2023-10-07 NOTE — Telephone Encounter (Signed)
 Patient dropped off document Disabled Dependent Child Certification form, to be filled out by provider. Patient requested to send it back via Call Patient to pick up within 7-days. Document is located in providers tray at front office.Please advise at Mobile [phone] (mobile)  Call mom Holli when ready to be picked up. Also need a letter showing the child's diagnoses or being disabled this is for her husbands insurance at work.   Form is placed in yellow folder up front on 08/07

## 2023-10-08 ENCOUNTER — Encounter: Payer: Self-pay | Admitting: Family Medicine

## 2023-10-08 NOTE — Telephone Encounter (Signed)
 Form was filled out, letter was dictated-please give to mother

## 2023-10-08 NOTE — Progress Notes (Signed)
 Please provide to the mother

## 2023-10-12 ENCOUNTER — Encounter (HOSPITAL_COMMUNITY): Payer: Self-pay | Admitting: Occupational Therapy

## 2023-10-12 ENCOUNTER — Ambulatory Visit (HOSPITAL_COMMUNITY): Admitting: Occupational Therapy

## 2023-10-12 DIAGNOSIS — R6339 Other feeding difficulties: Secondary | ICD-10-CM

## 2023-10-12 DIAGNOSIS — F840 Autistic disorder: Secondary | ICD-10-CM | POA: Diagnosis not present

## 2023-10-12 NOTE — Therapy (Signed)
 OUTPATIENT OCCUPATIONAL THERAPY FEEDING TREATMENT  Patient Name: Jon Adams MRN: [id number] DOB:2000-10-16, 23 y.o., male Today's Date: 10/12/2023  PCP: Alphonsa Hamilton, MD REFERRING PROVIDER: Bluford Clement, DO  END OF SESSION:  OT End of Session - 10/12/23 1525     Visit Number 8    Number of Visits 8    Date for OT Re-Evaluation 10/21/23    Authorization Type 1) UHC  2) [place] Medicaid    Authorization Time Period No auth for UHC;4 visits approved 8/12-9/10    Authorization - Visit Number 1    Authorization - Number of Visits 4    OT Start Time 1145    OT Stop Time 1225    OT Time Calculation (min) 40 min    Activity Tolerance Patient tolerated treatment well    Behavior During Therapy [facility] for tasks assessed/performed              Past Medical History:  Diagnosis Date   Autism    Development delay    Learning disabilities    Past Surgical History:  Procedure Laterality Date   BIOPSY  12/08/2022   Procedure: BIOPSY;  Surgeon: Cindie Carlin POUR, DO;  [facility]: [facility];  Service: Endoscopy;;   CIRCUMCISION  2003   COLONOSCOPY     DENTAL SURGERY     ESOPHAGOGASTRODUODENOSCOPY (EGD) WITH PROPOFOL  N/A 12/08/2022   Procedure: ESOPHAGOGASTRODUODENOSCOPY (EGD) WITH PROPOFOL ;  Surgeon: Cindie Carlin POUR, DO;  Location: [facility];  Service: Endoscopy;  Laterality: N/A;  945am, asa 2   UPPER GASTROINTESTINAL ENDOSCOPY     WISDOM TOOTH EXTRACTION  2024   Patient Active Problem List   Diagnosis Date Noted   Weight loss, non-intentional 03/29/2023   Abnormal thyroid  blood test 02/11/2023   Endocrine disorder, unspecified 11/12/2022   Hyperthyroidism 10/29/2022   Diarrhea of presumed infectious origin 02/27/2022   Heartburn 01/29/2016   Anxiety 08/27/2014   Insomnia 10/24/2013   Atopic rhinitis 05/31/2012   Autism 05/31/2012    ONSET DATE: 2024  REFERRING DIAG:  F84.0 (ICD-10-CM) - Autism  F41.9 (ICD-10-CM) - Anxiousness    THERAPY DIAG:  Autism  Other  feeding difficulties  Rationale for Evaluation and Treatment: Rehabilitation  SUBJECTIVE:   SUBJECTIVE STATEMENT: S: This has been a better week Pt accompanied by: self and family member  PERTINENT HISTORY: Pt with PMH of ASD, Anxiety, and GERD. Had feeding diffiuculties in the past, addressed and improved with OT. Pt has since had a stomach bug and now has sensory difficulties/choking sensation with eating.   PRECAUTIONS: None  WEIGHT BEARING RESTRICTIONS: No  PAIN:  Are you having pain? No  FALLS: Has patient fallen in last 6 months? No  PATIENT GOALS: To eat normally again  NEXT MD VISIT: Unsure  OBJECTIVE:  Note: Objective measures were completed at Evaluation unless otherwise noted.  HAND DOMINANCE: Right  ADLs: Eating: Able to self feed independently - max difficulties with eating/swallowing due to chocking sensation.   FUNCTIONAL OUTCOME MEASURES: Feeding Packet provided, will return next session  OBSERVATIONS: Extensive discussion with pt and mom regarding his feeding/eating difficulties. He reports mainly having swallowing difficulties, feeling like he is choking. Meats appear to cause the most difficulties, however chewy foods and fatty foods also are difficult. Mom reports that pt has to chew hard all the time and for long periods of time before he will attempt to swallow due to fear. He has this sensation at every meal and often times it will cause him  to stop eating which has resulted in weight loss and reflux.    TREATMENT DATE:  10/12/23 -Pt reports an improved week this week, has been focusing on soft foods and has been eating more and with less stress.  Was even able to eat 2 chicken tenders from [employer], removing the chewier/fatty parts. Did attempt pasta and chicken at a wedding but was unable to eat.  -Brought foods today, cheese stick with has not brought before. Trialed combining the cheese stick with a tortilla chip and pt able to eat two bites,  excessive chewing, and was able to take liquid wash and swallow without panic and without signs of struggle.  -Pt is currently eating about 2-3 meals per day with snacks between sometimes. Ate 1.5 donuts with coffee this morning.  -Reminded that the goal now is calories and not worry about swallowing larger pieces of food, but that if pt sees or smells something that he wants to try he can do that.  10/05/23 -Pt reports an improved week this week, has been focusing on soft foods and has been eating more and with less stress.  -Brought new food today-dates! Pt reports he really likes the taste and was able to eat one in 2 bites, continues to have excessive chewing today between biting and swallowing along with liquid wash. Pt brought tortilla chips, mini-cheese ritz, breakfast bar, and graham crackers. Trialing at least 1-2 bites of each food item and finishing a breakfast bar.  -Pt is currently eating about 3 meals per day, encouraged adding a snack mid-afternoon. Reports he found a yogurt brand, Oui, that is very filling and he really likes it.  -Reminded that the goal now is calories and not worry about swallowing larger pieces of food.   09/29/23 -Pt reports a rough week, wanted [employer] one day but unable to eat. Attempted pulled bbq one day but unable to eat it. Now unable to swallow a pill due to panic. Continues to lose weight.  -Discussed need for pt to get calories and nutrition. Created a list of soft foods that pt can manage and swallow with pt and Mom. Discussed not trying to swallow without chewing excessively at this time, main goal now is calories.  -Instructed pt to eat 5-6 small meals per day, drink at least 1 protein shake/smoothie per day. Make a meal plan the day before so that pt is not panicking while trying to decide what to eat.    PATIENT EDUCATION: Education details: continue with current meal plans Person educated: Patient Education method: Explanation, Demonstration,  and Handouts Education comprehension: verbalized understanding and returned demonstration  HOME EXERCISE PROGRAM: 10/05/23: add a snack in the afternoons  09/29/23: Homework for week of 7/30:  -Eat 5-6 small meals/day -Drink at least 1 protein shake/smoothie per day -Make meal plan for the next day  09/21/23:  Homework for week of 7/22:  -Self-affirmation before swallowing I can do it -Calming activity for 10 minutes before a meal -Challenge yourself to swallow easy foods without a wash or without chewing to puree (cheese puffs, ritz crackers, mango crisps, peaches)  09/17/23:  Homework for week of 7/18: -Talk to MD about referral to dietician.  -Talk to MD about counselor concerns and finding a counselor who can see weekly -Research protein smoothie recipes  -Trial small bites of fresh peach and cooked apple to determine texture tolerance -Continue prior weeks homework tasks  09/08/23: Homework for 09/08/23 week:  1) Brush teeth every night right before bed  2) If bite is too big, clear throat versus small coughs. Clearing the throat will move any residual food more effectively than small, closed-mouth coughs.  3) Focus of the week: chew 30 times, sip of water  to swish, then swallow. Working on not chewing additionally any more than absolutely necessary 4) Family goals: during a family meal, take a bite of the same food at the same time. When family is ready to swallow-raise a hand or get Delano's attention, see if he can swallow at the same time. It's ok to take a sip of water  and swish if needed.  5) Practice clearing your throat. Be POWERFUL!    GOALS: Goals reviewed with patient? Yes  SHORT TERM GOALS: Target date: 09/22/23  Pt will initiate a timely swallow with all solids without the use of liquid wash for 50% of trials over 3 consecutive sessions.   Goal status: IN PROGRESS   2.   Pt will use a rotary chew pattern with appropriate bolus formation and timely transfer for 50%  of trials over 3 consecutive sessions.    Goal status: IN PROGRESS   3.  Pt will improve feeding and nutritional intake to a functional level, eating >50% of a standard meal, 75% of trials.     Goal status: IN PROGRESS   4. Pt and caregiver will be educated on home strategies to utilize during meals to improve nutritional intake and reduce mealtimes to 30 minutes or less.    Goal status: IN PROGRESS   ASSESSMENT:  CLINICAL IMPRESSION: Gilford presenting to therapy with foods from home of various textures, adding cheese stick today. Keondre has had a better week with minimal panic, notes that wearing air pods helped with eating chicken tenders that he wanted to try from [employer]. Discussed completing session next week then transitioning care to nutrition. OT did send ARFID resources to MD who replied he would look into those.    PERFORMANCE DEFICITS: in functional skills including ADLs, IADLs, sensation, body mechanics, and sensory difficulties during eating, cognitive skills including problem solving and safety awareness, and psychosocial skills including coping strategies, environmental adaptation, and habits.       PLAN:  OT FREQUENCY: 1x/week  OT DURATION: 4 weeks  PLANNED INTERVENTIONS: 97168 OT Re-evaluation, 97535 self care/ADL training, 02889 therapeutic exercise, 97530 therapeutic activity, 97112 neuromuscular re-education, 97140 manual therapy, coping strategies training, patient/family education, and DME and/or AE instructions  RECOMMENDED OTHER SERVICES: Referral to counselor/psychology/psychiatry for ARFID  CONSULTED AND AGREED WITH PLAN OF CARE: Patient and family member/caregiver  PLAN FOR NEXT SESSION: discharge pt   Sonny Cory, OTR/L  [phone] 10/12/2023, 3:26 PM

## 2023-10-15 ENCOUNTER — Telehealth: Payer: Self-pay

## 2023-10-15 NOTE — Telephone Encounter (Signed)
 Prescription Request  10/15/2023  LOV: Visit date not found  What is the name of the medication or equipment? sertraline  (ZOLOFT ) 25 MG tablet   Have you contacted your pharmacy to request a refill? Yes   Which pharmacy would you like this sent to?  [facility] [place], [place] - [id number] [place] [address] Phone: [phone] Fax: [phone]    Patient notified that their request is being sent to the clinical staff for review and that they should receive a response within 2 business days.   Please advise at Mobile [phone] (mobile)

## 2023-10-18 ENCOUNTER — Telehealth: Payer: Self-pay | Admitting: Pharmacy Technician

## 2023-10-18 ENCOUNTER — Other Ambulatory Visit (HOSPITAL_COMMUNITY): Payer: Self-pay

## 2023-10-18 ENCOUNTER — Encounter: Payer: Self-pay | Admitting: Nurse Practitioner

## 2023-10-18 ENCOUNTER — Ambulatory Visit: Admitting: Nurse Practitioner

## 2023-10-18 ENCOUNTER — Encounter: Payer: Self-pay | Admitting: Family Medicine

## 2023-10-18 VITALS — BP 101/72 | HR 76 | Temp 97.3°F | Ht 69.0 in | Wt 119.0 lb

## 2023-10-18 DIAGNOSIS — F419 Anxiety disorder, unspecified: Secondary | ICD-10-CM

## 2023-10-18 DIAGNOSIS — R634 Abnormal weight loss: Secondary | ICD-10-CM | POA: Diagnosis not present

## 2023-10-18 DIAGNOSIS — F5082 Avoidant/restrictive food intake disorder: Secondary | ICD-10-CM

## 2023-10-18 DIAGNOSIS — K219 Gastro-esophageal reflux disease without esophagitis: Secondary | ICD-10-CM

## 2023-10-18 DIAGNOSIS — F840 Autistic disorder: Secondary | ICD-10-CM

## 2023-10-18 DIAGNOSIS — K580 Irritable bowel syndrome with diarrhea: Secondary | ICD-10-CM

## 2023-10-18 MED ORDER — PANTOPRAZOLE SODIUM 40 MG PO PACK
40.0000 mg | PACK | Freq: Every day | ORAL | 5 refills | Status: DC
Start: 2023-10-18 — End: 2023-12-08

## 2023-10-18 MED ORDER — SERTRALINE HCL 50 MG PO TABS: 50.0000 mg | ORAL_TABLET | Freq: Every day | ORAL | 0 refills | Status: DC

## 2023-10-18 NOTE — Progress Notes (Signed)
 Subjective:    Patient ID: Jon Adams, male    DOB: 11/02/2000, 23 y.o.   MRN: [id number]  HPI Discussed the use of AI scribe software for clinical note transcription with the patient, who gave verbal consent to proceed.  History of Present Illness Jon Adams is a 23 year old male who presents with anxiety and swallowing difficulties. He is accompanied by his mother. Defers being interviewed alone.   He has anxiety related to swallowing, leading to a fear of choking. Occupational therapy has been beneficial, and he recently managed to eat more challenging foods like half a cheeseburger and some steak. However, two weeks ago, heightened anxiety caused him to revert to eating only soft foods.  He is taking sertraline  for anxiety, initially at a low dose, which has been beneficial. He feels the dose may need to be increased as he still experiences some anxiety. He ran out of his medication on Saturday and is awaiting a refill. Initially, he experienced increased bowel movements before starting the medication, but this has improved. Denies any adverse effects.   He has reflux and difficulty swallowing pills. He attempted to switch to a liquid form of his medication but received capsules instead. Omeprazole  helps his reflux when taken consistently. He is currently taking clonidine  at bedtime to aid with sleep and anxiety.  There is concern for Avoidant/Restrictive Food Intake Disorder (ARFID) due to restricted eating patterns, possibly triggered by a past choking incident and a stomach bug in 2024. He has experienced some weight loss, which has stabilized over the past two weeks. He occasionally uses nutritional supplements, particularly at bedtime, to ensure adequate vitamin and mineral intake. Has been referred to a specialist.   No chest pain, shortness of breath, coughing, or wheezing. His bowel movements have normalized, and he reports no current abdominal pain.     Review of Systems   HENT:  Positive for trouble swallowing.   Respiratory:  Negative for cough, chest tightness, shortness of breath and wheezing.   Cardiovascular:  Negative for chest pain.  Gastrointestinal:  Negative for abdominal pain, constipation, diarrhea, nausea and vomiting.      10/18/2023    9:24 AM  Depression screen PHQ 2/9  Decreased Interest 0  Down, Depressed, Hopeless 0  PHQ - 2 Score 0  Altered sleeping 0  Tired, decreased energy 0  Change in appetite 3  Feeling bad or failure about yourself  0  Trouble concentrating 0  Moving slowly or fidgety/restless 1  Suicidal thoughts 0  PHQ-9 Score 4  Difficult doing work/chores Somewhat difficult      10/18/2023    9:25 AM 09/16/2023    3:08 PM 08/11/2023    2:05 PM 06/09/2023   10:22 AM  GAD 7 : Generalized Anxiety Score  Nervous, Anxious, on Edge 2 3 0   Control/stop worrying 1 2 0   Worry too much - different things 1  0   Trouble relaxing 2 3 0   Restless 2 0 0   Easily annoyed or irritable 2 3 1    Afraid - awful might happen 0 2 0   Total GAD 7 Score 10  1   Anxiety Difficulty Somewhat difficult Very difficult Not difficult at all      Information is confidential and restricted. Go to Review Flowsheets to unlock data.    Social History   Tobacco Use   Smoking status: Never   Smokeless tobacco: Never  Substance Use Topics  Alcohol use: No    Alcohol/week: 0.0 standard drinks of alcohol   Drug use: No       Objective:   Physical Exam Vitals and nursing note reviewed.  Constitutional:      General: He is not in acute distress. Cardiovascular:     Rate and Rhythm: Normal rate and regular rhythm.     Heart sounds: Normal heart sounds.  Pulmonary:     Effort: Pulmonary effort is normal.     Breath sounds: Normal breath sounds.  Abdominal:     General: There is no distension.     Palpations: Abdomen is soft. There is no mass.     Tenderness: There is no abdominal tenderness. There is no guarding or rebound.   Neurological:     Mental Status: He is alert and oriented to person, place, and time.  Psychiatric:        Mood and Affect: Mood normal.        Behavior: Behavior normal.        Thought Content: Thought content normal.    Today's Vitals   10/18/23 0919  BP: 101/72  Pulse: 76  Temp: (!) 97.3 F (36.3 C)  SpO2: 97%  Weight: 119 lb (54 kg)  Height: 5' 9 (1.753 m)   Body mass index is 17.57 kg/m.        Assessment & Plan:   Problem List Items Addressed This Visit       Digestive   Gastroesophageal reflux disease without esophagitis   Relevant Medications   pantoprazole  sodium (PROTONIX ) 40 mg   Irritable bowel syndrome with diarrhea   Relevant Medications   pantoprazole  sodium (PROTONIX ) 40 mg     Other   Anxiety   Relevant Medications   sertraline  (ZOLOFT ) 50 MG tablet   Autism   Avoidant-restrictive food intake disorder (ARFID) - Primary   Weight loss, non-intentional    Assessment & Plan Feeding difficulty in adult with non-intentional weight loss Feeding difficulties due to anxiety about swallowing, leading to non-intentional weight loss. Weight stabilized with soft and some solid foods. Occupational therapy beneficial. Pending ARFID evaluation due to autism and anxiety-related eating issues. - Continue occupational therapy. - Check on the referral to Dr. Severo for ARFID evaluation.  Anxiety On sertraline  with some improvement. Current dose insufficient, requires increase. Informed about potential side effects and box warnings.  - Increase sertraline  dose to 50 mg daily. - Monitor for side effects and any unusual behavior. - Return for follow-up in one month to assess effectiveness.  Gastroesophageal reflux disease Difficulty swallowing pills affects omeprazole  intake. Omeprazole  effective when taken consistently. Plan to switch to liquid pantoprazole . - Switch to pantoprazole  liquid formulation. - Ensure daily administration of  pantoprazole .  Irritable bowel syndrome Improved with Sertraline . IBS symptoms likely tied to anxiety, expected to improve with anxiety management. - Address anxiety management to help alleviate IBS symptoms.  Autism spectrum disorder Autism with sensory issues and anxiety contributes to feeding difficulties and anxiety. Occupational therapy ongoing, ARFID evaluation pending. - Continue occupational therapy. - Check on the referral to Dr. Severo for ARFID evaluation.  Return in about 1 month (around 11/18/2023).

## 2023-10-18 NOTE — Telephone Encounter (Signed)
 Pharmacy Patient Advocate Encounter   Received notification from CoverMyMeds that prior authorization for Pantoprazole  Sodium 40MG  packets is required/requested.   Insurance verification completed.   The patient is insured through [facility] .   Per test claim: PA required; PA submitted to above mentioned insurance via Latent Key/confirmation #/EOC [id number] Status is pending

## 2023-10-18 NOTE — Progress Notes (Signed)
 Progress note

## 2023-10-19 ENCOUNTER — Ambulatory Visit (HOSPITAL_COMMUNITY): Admitting: Occupational Therapy

## 2023-10-19 ENCOUNTER — Encounter (HOSPITAL_COMMUNITY): Payer: Self-pay | Admitting: Occupational Therapy

## 2023-10-19 ENCOUNTER — Telehealth: Payer: Self-pay | Admitting: Pharmacist

## 2023-10-19 ENCOUNTER — Other Ambulatory Visit (HOSPITAL_COMMUNITY): Payer: Self-pay

## 2023-10-19 DIAGNOSIS — F840 Autistic disorder: Secondary | ICD-10-CM

## 2023-10-19 DIAGNOSIS — R6339 Other feeding difficulties: Secondary | ICD-10-CM

## 2023-10-19 NOTE — Therapy (Signed)
 OUTPATIENT OCCUPATIONAL THERAPY FEEDING TREATMENT DISCHARGE SUMMARY  Patient Name: Jon Adams MRN: [id number] DOB:[birth year], 23 y.o., male Today's Date: 10/19/2023  OCCUPATIONAL THERAPY DISCHARGE SUMMARY  Visits from Start of Care: 9  Current functional level related to goals / functional outcomes: See below. Care is being transferred to nutrition and psychology referral has been placed.    Remaining deficits: Continued anxiety and panic with swallowing textured food   Education / Equipment: Strategies for focus on softer foods and to decrease panic   Patient agrees to discharge. Patient goals were partially met. Patient is being discharged due to transition of care.SABRA     PCP: Alphonsa Hamilton, MD REFERRING PROVIDER: Bluford Clement, DO  END OF SESSION:  OT End of Session - 10/19/23 1220     Visit Number 9    Number of Visits 9    Date for OT Re-Evaluation 10/21/23    Authorization Type 1) UHC  2) [place] Medicaid    Authorization Time Period No auth for UHC;4 visits approved 8/12-9/10    Authorization - Visit Number 2    Authorization - Number of Visits 4    OT Start Time 1105    OT Stop Time 1135    OT Time Calculation (min) 30 min    Activity Tolerance Patient tolerated treatment well    Behavior During Therapy [facility] for tasks assessed/performed               Past Medical History:  Diagnosis Date   Autism    Development delay    Diarrhea of presumed infectious origin 02/27/2022   Learning disabilities    Past Surgical History:  Procedure Laterality Date   BIOPSY  12/08/2022   Procedure: BIOPSY;  Surgeon: Cindie Carlin POUR, DO;  Location: [facility];  Service: Endoscopy;;   CIRCUMCISION  2003   COLONOSCOPY     DENTAL SURGERY     ESOPHAGOGASTRODUODENOSCOPY (EGD) WITH PROPOFOL  N/A 12/08/2022   Procedure: ESOPHAGOGASTRODUODENOSCOPY (EGD) WITH PROPOFOL ;  Surgeon: Cindie Carlin POUR, DO;  Location: [facility];  Service: Endoscopy;  Laterality: N/A;  945am,  asa 2   UPPER GASTROINTESTINAL ENDOSCOPY     WISDOM TOOTH EXTRACTION  2024   Patient Active Problem List   Diagnosis Date Noted   Irritable bowel syndrome with diarrhea 10/18/2023   Avoidant-restrictive food intake disorder (ARFID) 10/18/2023   Gastroesophageal reflux disease without esophagitis 10/18/2023   Weight loss, non-intentional 03/29/2023   Abnormal thyroid  blood test 02/11/2023   Endocrine disorder, unspecified 11/12/2022   Hyperthyroidism 10/29/2022   Heartburn 01/29/2016   Anxiety 08/27/2014   Insomnia 10/24/2013   Atopic rhinitis 05/31/2012   Autism 05/31/2012    ONSET DATE: 2024  REFERRING DIAG:  F84.0 (ICD-10-CM) - Autism  F41.9 (ICD-10-CM) - Anxiousness    THERAPY DIAG:  Autism  Other feeding difficulties  Rationale for Evaluation and Treatment: Rehabilitation  SUBJECTIVE:   SUBJECTIVE STATEMENT: S: I ate some of a burger this week Pt accompanied by: self and family member  PERTINENT HISTORY: Pt with PMH of ASD, Anxiety, and GERD. Had feeding diffiuculties in the past, addressed and improved with OT. Pt has since had a stomach bug and now has sensory difficulties/choking sensation with eating.   PRECAUTIONS: None  WEIGHT BEARING RESTRICTIONS: No  PAIN:  Are you having pain? No  FALLS: Has patient fallen in last 6 months? No  PATIENT GOALS: To eat normally again  NEXT MD VISIT: Unsure  OBJECTIVE:  Note: Objective measures were completed at  Evaluation unless otherwise noted.  HAND DOMINANCE: Right  ADLs: Eating: Able to self feed independently - max difficulties with eating/swallowing due to chocking sensation.   FUNCTIONAL OUTCOME MEASURES: Feeding Packet provided, will return next session  OBSERVATIONS: Extensive discussion with pt and mom regarding his feeding/eating difficulties. He reports mainly having swallowing difficulties, feeling like he is choking. Meats appear to cause the most difficulties, however chewy foods and fatty  foods also are difficult. Mom reports that pt has to chew hard all the time and for long periods of time before he will attempt to swallow due to fear. He has this sensation at every meal and often times it will cause him to stop eating which has resulted in weight loss and reflux.    TREATMENT DATE:  10/19/23 -Pt reports he ate almost half of a burger this week and a couple bites of steak with the skin cut off. Reports after about half the burger he began to feel discomfort in the back of his throat so stopped trying to eat more burger at that point.  -Pt is trying various types of high calorie yogurts, eating ice cream as well. -Has maintained around the 117-119# range and has not had another period of rapid weight loss.  -Pt brought chips, dates, and a breakfast bar. OT challenging pt to mix up eating a bite of one and then a bite of another versus eating all of one thing before moving on to the next. Discussed trying this method with more difficulty foods like a burger. Eat some burger, then eat something smooth that is easy and not stressful to swallow. Then try to go back to the burger if still wanting to try eating that.  -Pt able to mix up foods well today, did take liquid washes frequently. OT noting larger chews today versus the smaller more rapid chewing.   10/12/23 -Pt reports an improved week this week, has been focusing on soft foods and has been eating more and with less stress.  Was even able to eat 2 chicken tenders from [employer], removing the chewier/fatty parts. Did attempt pasta and chicken at a wedding but was unable to eat.  -Brought foods today, cheese stick with has not brought before. Trialed combining the cheese stick with a tortilla chip and pt able to eat two bites, excessive chewing, and was able to take liquid wash and swallow without panic and without signs of struggle.  -Pt is currently eating about 2-3 meals per day with snacks between sometimes. Ate 1.5 donuts with  coffee this morning.  -Reminded that the goal now is calories and not worry about swallowing larger pieces of food, but that if pt sees or smells something that he wants to try he can do that.  10/05/23 -Pt reports an improved week this week, has been focusing on soft foods and has been eating more and with less stress.  -Brought new food today-dates! Pt reports he really likes the taste and was able to eat one in 2 bites, continues to have excessive chewing today between biting and swallowing along with liquid wash. Pt brought tortilla chips, mini-cheese ritz, breakfast bar, and graham crackers. Trialing at least 1-2 bites of each food item and finishing a breakfast bar.  -Pt is currently eating about 3 meals per day, encouraged adding a snack mid-afternoon. Reports he found a yogurt brand, Oui, that is very filling and he really likes it.  -Reminded that the goal now is calories and not worry about  swallowing larger pieces of food.     PATIENT EDUCATION: Education details: continue with current meal plans Person educated: Patient Education method: Explanation, Demonstration, and Handouts Education comprehension: verbalized understanding and returned demonstration  HOME EXERCISE PROGRAM:  10/19/23  Alternate harder to chew/swallow and easier to chew/swallow items.    10/05/23: add a snack in the afternoons  09/29/23: Homework for week of 7/30:  -Eat 5-6 small meals/day -Drink at least 1 protein shake/smoothie per day -Make meal plan for the next day  09/21/23:  Homework for week of 7/22:  -Self-affirmation before swallowing I can do it -Calming activity for 10 minutes before a meal -Challenge yourself to swallow easy foods without a wash or without chewing to puree (cheese puffs, ritz crackers, mango crisps, peaches)  09/17/23:  Homework for week of 7/18: -Talk to MD about referral to dietician.  -Talk to MD about counselor concerns and finding a counselor who can see  weekly -Research protein smoothie recipes  -Trial small bites of fresh peach and cooked apple to determine texture tolerance -Continue prior weeks homework tasks  09/08/23: Homework for 09/08/23 week:  1) Brush teeth every night right before bed 2) If bite is too big, clear throat versus small coughs. Clearing the throat will move any residual food more effectively than small, closed-mouth coughs.  3) Focus of the week: chew 30 times, sip of water  to swish, then swallow. Working on not chewing additionally any more than absolutely necessary 4) Family goals: during a family meal, take a bite of the same food at the same time. When family is ready to swallow-raise a hand or get Chanel's attention, see if he can swallow at the same time. It's ok to take a sip of water  and swish if needed.  5) Practice clearing your throat. Be POWERFUL!    GOALS: Goals reviewed with patient? Yes  SHORT TERM GOALS: Target date: 09/22/23  Pt will initiate a timely swallow with all solids without the use of liquid wash for 50% of trials over 3 consecutive sessions.   Goal status: NOT MET  2.   Pt will use a rotary chew pattern with appropriate bolus formation and timely transfer for 50% of trials over 3 consecutive sessions.    Goal status: MET (with soft foods)  3.  Pt will improve feeding and nutritional intake to a functional level, eating >50% of a standard meal, 75% of trials.     Goal status: NOT MET  4. Pt and caregiver will be educated on home strategies to utilize during meals to improve nutritional intake and reduce mealtimes to 30 minutes or less.    Goal status: MET  ASSESSMENT:  CLINICAL IMPRESSION: Reassessment completed this session, pt has met one goal and partially met an additional goals. Care is being transitioned to nutrition and referrals have been placed for psychology for possible ARFID. Pt wants to eat various foods but is unable due to anxiety and panic around swallowing. Pt with oral  motor skills WNL, no deficits in this area. Pt is currently focusing on softer foods that are easier to swallow versus trying to swallow thicker or larger bites with texture. Current goal is calories and decreased anxiety while waiting on psychology/nutrition. Pt and mother agreeable to discharge at this time.    PERFORMANCE DEFICITS: in functional skills including ADLs, IADLs, sensation, body mechanics, and sensory difficulties during eating, cognitive skills including problem solving and safety awareness, and psychosocial skills including coping strategies, environmental adaptation, and habits.  PLAN:  OT FREQUENCY: 1x/week  OT DURATION: 4 weeks  PLANNED INTERVENTIONS: 97168 OT Re-evaluation, 97535 self care/ADL training, 02889 therapeutic exercise, 97530 therapeutic activity, 97112 neuromuscular re-education, 97140 manual therapy, coping strategies training, patient/family education, and DME and/or AE instructions  RECOMMENDED OTHER SERVICES: Referral to counselor/psychology/psychiatry for ARFID  CONSULTED AND AGREED WITH PLAN OF CARE: Patient and family member/caregiver  PLAN FOR NEXT SESSION: discharge pt   Sonny Cory, OTR/L  [phone] 10/19/2023, 12:21 PM

## 2023-10-19 NOTE — Telephone Encounter (Signed)
 Appeal has been submitted for pantoprazole  packets. Will advise when response is received or follow up in 1 week. Please be advised that most companies may take 30 days to make a decision. Appeal letter and supporting information have been faxed to [phone] on 10/19/2023 @4 :23 pm.  Thank you, Devere Pandy, PharmD Clinical Pharmacist  [place]  Direct Dial: [phone]

## 2023-10-19 NOTE — Telephone Encounter (Signed)
 Pharmacy Patient Advocate Encounter  Received notification from [facility] that Prior Authorization for Pantoprazole  Sodium 40MG  packets  has been DENIED.  Full denial letter will be uploaded to the media tab. See denial reason below.   PA #/Case ID/Reference #: [id number]  The patient's difficulty swallowing due to autism sensory issues and anxiety were detailed in the request. Our appeals pharmacist to going to file an appeal for this.

## 2023-10-20 ENCOUNTER — Other Ambulatory Visit (HOSPITAL_COMMUNITY): Payer: Self-pay

## 2023-10-20 NOTE — Telephone Encounter (Signed)
 Insurance has approved the appeal for pantoprazole  packets through 10/19/2024.    Thank you, Devere Pandy, PharmD Clinical Pharmacist  [place]  Direct Dial: [phone]

## 2023-10-21 ENCOUNTER — Other Ambulatory Visit (INDEPENDENT_AMBULATORY_CARE_PROVIDER_SITE_OTHER): Payer: Self-pay | Admitting: Family

## 2023-10-21 DIAGNOSIS — G4700 Insomnia, unspecified: Secondary | ICD-10-CM

## 2023-10-25 ENCOUNTER — Telehealth: Payer: Self-pay | Admitting: Family Medicine

## 2023-10-25 NOTE — Telephone Encounter (Signed)
 Occupational therapist sent a note regarding this patient I reached out to mom with a MyChart message If mom decides to do the referral we will put it in  See message from occupational therapist below Thank you for referring him to nutrition and trying to find support for possible ARFID. There are two programs in the [place] area that he might benefit from as a resource. One is [facility] which has both inpatient and outpatient programs for eating disorders, and the [facility] for Eating Disorders. I gave his Mom the information for both of these for her to research and to discuss with you, if you feel either would be appropriate.

## 2023-10-26 ENCOUNTER — Ambulatory Visit: Admitting: Nutrition

## 2023-10-26 ENCOUNTER — Encounter: Attending: Family Medicine | Admitting: Registered"

## 2023-10-26 ENCOUNTER — Encounter: Payer: Self-pay | Admitting: Registered"

## 2023-10-26 DIAGNOSIS — F5082 Avoidant/restrictive food intake disorder: Secondary | ICD-10-CM | POA: Insufficient documentation

## 2023-10-26 NOTE — Progress Notes (Unsigned)
 Appointment start time: 11:02  Appointment end time: 11:52  Patient was seen on 10/26/2023 for nutrition counseling pertaining to disordered eating  Primary care provider: Glendia Fielding, MD Therapist: in process of seeking therapist  ROI: N/A Any other medical team members: none Parents: mom  ([id number]   Assessment  Mom states in 05/2022 they caught a stomach bug and since then he feels like he is choking although he is not choking. Reports various testing and nothing abnormal is happening.  States he has seen OT and they've hjelped Currently eating a lot of soft foods: mashed potatoes, ice cream, etc.   Growth Metrics: Median BMI for age: *** BMI today: *** % median today:  *** Previous growth data: weight/age  ***; height/age at ***; BMI/age *** Goal BMI range based on growth chart data: *** % goal BMI: *** Goal weight range based on growth chart data: *** Goal rate of weight gain:  ***  Eating history: Length of time: 1 year, since 05/2022 Previous treatments: none Goals for RD meetings: ***  Weight history:  Highest weight: 149   Lowest weight: 116 Most consistent weight:   What would you like to weigh: How has weight changed in the past year: weight loss  Medical Information:  Changes in hair, skin, nails since ED started: no Chewing/swallowing difficulties: yes, chews alot until pureed and sometimes he has to force himself; feels like food gets stuck Reflux or heartburn: yes, takes meds as needed Trouble with teeth: no Constipation, diarrhea: no, has BM at least once/day Dizziness/lightheadedness: yes, sporadic a few times a month Headaches/body aches: yes, states it is due to increase in zoloft  Heart racing/chest pain: no Mood: neutral; has adequate neergy Sleep: denies sleep challenges; sleeps 6-7 hrs/night Focus/concentration: sometimes Cold intolerance: sometimes Vision changes: no  Mental health diagnosis:    Dietary assessment: A typical day consists of  ***meals and *** snacks  Safe foods include: yogurt, milkshakes, ice cream, floats, mashed potatoes, sweet potatoes, baked potatoes, soups, cheese spread, ritz crackers, smoothies, [employer] protein shakes, protein powder mixed with milkshake, dates, fruit cups, strawberries, cooked apples, doughnuts, applesauce, rotisserie chicken,   Moderate: biscuit, muffin, burger, steak, fish,   Avoided foods include: fried foods, pasta, mac and cheese  Used to eat: beans, corn, broccoli, carrots, eggs,   24 hour recall:  B (9 am): doughnut + coffee S:  L (12-3 pm): mashed potatoes + cheese crackers (3 pairs) + Oui yogurt  S: D (6 pm): Cookout: 3/4 chicken quesadilla + cheerwine float S:  Beverages: sweet tea (16 oz), Cheerwine float (16 oz), ginger ale (8 oz), water  (16 oz); 56 oz  Physical activity: walking 10 min, once/week   What Methods Do You Use To Control Your Weight (Compensatory behaviors)?           Restricting (calories, fat, carbs)  SIV  Diet pills  Laxatives  Diuretics  Alcohol or drugs  Exercise (what type)  Food rules or rituals (explain)  Binge  Estimated energy intake: *** kcal  Estimated energy needs: *** kcal *** g CHO *** g pro *** g fat  Nutrition Diagnosis: {CHL AMB NUTRITIONAL [phone]}  Intervention/Goals: ***   Meal plan:    *** meals    *** snacks To provide *** kcal    *** g CHO    *** g pro   *** g fat  # exchanges: *** starch *** protein *** fat *** dairy *** fruit *** vegetable  B: *** starch *** protein *** fat ***  dairy *** fruit  *** vegetable S: *** starch *** protein *** fat *** dairy *** fruit  *** vegetable L: *** starch *** protein *** fat *** dairy *** fruit  *** vegetable S: *** starch *** protein *** fat *** dairy *** fruit  *** vegetable D: *** starch *** protein *** fat *** dairy *** fruit  *** vegetable S: *** starch *** protein *** fat *** dairy *** fruit  *** vegetable  Monitoring and Evaluation: Patient  will follow up in *** weeks.

## 2023-10-26 NOTE — Patient Instructions (Addendum)
-   Aim for 3 adequate meals a day.   - Meals to include 1/2 plate of starch/grains + 1/4 plate of protein + 1/4 plate of fruit/vegetable + lipid + dairy/calcium. Examples include: Mashed potatoes made with whole milk and butter + rotisserie chicken + strawberries + yogurt  - Add protein to smoothies from [employer].   - Consume food before drinking coffee.

## 2023-11-16 ENCOUNTER — Ambulatory Visit: Admitting: Nurse Practitioner

## 2023-11-16 ENCOUNTER — Encounter: Payer: Self-pay | Admitting: Nurse Practitioner

## 2023-11-16 VITALS — BP 115/72 | Ht 69.0 in | Wt 121.4 lb

## 2023-11-16 DIAGNOSIS — F5082 Avoidant/restrictive food intake disorder: Secondary | ICD-10-CM

## 2023-11-16 DIAGNOSIS — Z23 Encounter for immunization: Secondary | ICD-10-CM | POA: Diagnosis not present

## 2023-11-16 DIAGNOSIS — F419 Anxiety disorder, unspecified: Secondary | ICD-10-CM | POA: Diagnosis not present

## 2023-11-16 DIAGNOSIS — K219 Gastro-esophageal reflux disease without esophagitis: Secondary | ICD-10-CM

## 2023-11-16 NOTE — Progress Notes (Signed)
 Subjective:    Patient ID: Jon Adams, male    DOB: Jan 09, 2001, 23 y.o.   MRN: [id number]  HPI Discussed the use of AI scribe software for clinical note transcription with the patient, who gave verbal consent to proceed.  History of Present Illness Jon Adams is a 23 year old male with avoidant restrictive food intake disorder who presents for a recheck. Mother present with him today.  He has been adhering to a diet of soft foods and has gained two pounds, bringing his weight back above 120 pounds. He typically consumes yogurt or coffee with protein shakes in the morning and is concerned about maintaining protein intake to prevent muscle mass loss.  He was previously seeing a speech therapist for oral issues but has been discharged from that care at this time. He is also consulting with a nutritionist to assist with his dietary needs.  He is taking pantoprazole  daily for acid reflux, and reports that it helps. No abdominal pain, nausea, vomiting, or changes in bowel habits. Bowel movements are normal in color and consistency.  He is on sertraline  50 mg daily, which he feels is an appropriate dose.   He has not needed to use his nausea medication recently. No chest pain, shortness of breath, coughing, or wheezing.   Review of Systems  Respiratory:  Negative for cough, chest tightness, shortness of breath and wheezing.   Cardiovascular:  Negative for chest pain.  Gastrointestinal:  Negative for abdominal pain, blood in stool, constipation, diarrhea, nausea and vomiting.      10/18/2023    9:25 AM 09/16/2023    3:08 PM 08/11/2023    2:05 PM 06/09/2023   10:22 AM  GAD 7 : Generalized Anxiety Score  Nervous, Anxious, on Edge 2 3 0   Control/stop worrying 1 2 0   Worry too much - different things 1  0   Trouble relaxing 2 3 0   Restless 2 0 0   Easily annoyed or irritable 2 3 1    Afraid - awful might happen 0 2 0   Total GAD 7 Score 10  1   Anxiety Difficulty Somewhat difficult  Very difficult Not difficult at all      Information is confidential and restricted. Go to Review Flowsheets to unlock data.    Social History   Tobacco Use   Smoking status: Never   Smokeless tobacco: Never  Substance Use Topics   Alcohol use: No    Alcohol/week: 0.0 standard drinks of alcohol   Drug use: No        Objective:   Physical Exam Vitals and nursing note reviewed.  Constitutional:      General: He is not in acute distress. Cardiovascular:     Rate and Rhythm: Normal rate and regular rhythm.     Heart sounds: Normal heart sounds.  Pulmonary:     Effort: Pulmonary effort is normal.     Breath sounds: Normal breath sounds.  Abdominal:     General: There is no distension.     Palpations: Abdomen is soft. There is no mass.     Tenderness: There is no abdominal tenderness. There is no guarding or rebound.  Neurological:     Mental Status: He is alert.  Psychiatric:        Mood and Affect: Mood normal.        Behavior: Behavior normal.        Thought Content: Thought content normal.  Comments: Cheerful affect. Making good eye contact.     Today's Vitals   11/16/23 0947  BP: 115/72  Weight: 121 lb 6.4 oz (55.1 kg)  Height: 5' 9 (1.753 m)   Body mass index is 17.93 kg/m.       Assessment & Plan:  1. Need for vaccination - Flu vaccine trivalent PF, 6mos and older(Flulaval,Afluria,Fluarix,Fluzone)  2. Avoidant-restrictive food intake disorder (ARFID) (Primary) Continue dietary habits and follow up with dietician.  3. Anxiety Continue Sertraline  50 mg daily.  4. Gastroesophageal reflux disease without esophagitis Continue Pantoprazole  daily as needed. If symptoms improve over time, consider decreasing to prn.   Return in about 3 months (around 02/15/2024).

## 2023-11-17 ENCOUNTER — Encounter: Attending: Family Medicine | Admitting: Registered"

## 2023-11-17 ENCOUNTER — Encounter: Payer: Self-pay | Admitting: Registered"

## 2023-11-17 DIAGNOSIS — F840 Autistic disorder: Secondary | ICD-10-CM | POA: Diagnosis not present

## 2023-11-17 DIAGNOSIS — R634 Abnormal weight loss: Secondary | ICD-10-CM | POA: Insufficient documentation

## 2023-11-17 DIAGNOSIS — F5082 Avoidant/restrictive food intake disorder: Secondary | ICD-10-CM | POA: Insufficient documentation

## 2023-11-17 DIAGNOSIS — Z713 Dietary counseling and surveillance: Secondary | ICD-10-CM | POA: Insufficient documentation

## 2023-11-17 NOTE — Patient Instructions (Addendum)
-   Breakfast can be 1 cup yogurt + handful of fruit + doughnut.   - Smoothies to include yogurt + multiple fruit options + collagen.   - Meals to include 1/2 plate of starch/grains + 1/4 plate of protein + 1/4 plate of fruit/vegetable + lipid + dairy/calcium. Examples include: Mashed potatoes made with whole milk and butter + rotisserie chicken + strawberries + yogurt

## 2023-11-17 NOTE — Progress Notes (Signed)
 Appointment start time: 3:12  Appointment end time: 3:49  Patient was seen on 11/17/2023 for nutrition counseling pertaining to disordered eating  Primary care provider: Glendia Fielding, MD Therapist: in process of seeking therapist  ROI: N/A Any other medical team members: none Parents: mom  Horris)   Assessment  Pt arrives with mom.  States he had a medical appt yesterday and gained 2 lbs. Reports some improvements and some things are still the same. States he is now consuming food before drinking coffee.  States he starts to feel full in the mornings when eating breakfast.    Growth Metrics: Previous growth data: weight/age  63-75th %; height/age at 75-90th %; BMI/age 43-50th % Goal weight range based on growth chart data: 147+ Goal rate of weight gain:  0.5-1.0 lb/week  Eating history: Length of time: 1 year, since 05/2022 Previous treatments: none Goals for RD meetings: improve dizziness/lightheadedness, headaches, focus/concentration, cold intolerance  Weight history:  Highest weight: 149   Lowest weight: 116 Most consistent weight:   What would you like to weigh: How has weight changed in the past year: weight loss  Medical Information:  Changes in hair, skin, nails since ED started: no Chewing/swallowing difficulties: yes, chews alot until pureed and sometimes he has to force himself; feels like food gets stuck Reflux or heartburn: yes, takes meds as needed Trouble with teeth: no Constipation, diarrhea: no, has BM at least once/day Dizziness/lightheadedness: yes, sporadic a few times a month Headaches/body aches: yes, states it is due to increase in zoloft  Heart racing/chest pain: no Mood: neutral; fatigue Sleep: denies sleep challenges; sleeps 6-7 hrs/night Focus/concentration: sometimes Cold intolerance: sometimes Vision changes: no  Mental health diagnosis: ARFID   Dietary assessment: A typical day consists of 3 meals and 0 snacks  Safe foods include:  yogurt, milkshakes, ice cream, floats, mashed potatoes, sweet potatoes, baked potatoes, soups, cheese spread, ritz crackers, smoothies, FairLife protein shakes, protein powder mixed with milkshake, dates, fruit cups, strawberries, cooked apples, doughnuts, applesauce, rotisserie chicken,   Moderate: biscuit, muffin, burger, steak, fish  Avoided foods include: fried foods, pasta, mac and cheese  Used to eat: beans, corn, broccoli, carrots, eggs   24 hour recall:  B (9 am): 1.5 c yogurt (full fat) + pumpkin spice doughnut + coffee (collagen)   S:  L (12-3 pm): 1.5 c potato soup (cream cheese, cheese, bone broth) + pretzel bite combos  S: doughnut  D (6 pm): 1.5 c potato soup (cream cheese, cheese, bone broth) + [employer] ice cream bar S:  Beverages: coffee (8 oz), peach tea (16 oz), water  (16 oz); 40 oz  Physical activity: walking 10 min, once/week   What Methods Do You Use To Control Your Weight (Compensatory behaviors)?           Restricting (calories, fat, carbs)  SIV  Diet pills  Laxatives  Diuretics  Alcohol or drugs  Exercise (what type)  Food rules or rituals (explain)  Binge  Estimated energy intake: 1500-1600 kcal  Estimated energy needs: 2600-2800 kcal 325-350 g CHO 130-140 g pro 87-93 g fat  Nutrition Diagnosis: NB-1.1 Food and nutrition-related knowledge deficit As related to ARFID.  As evidenced by failure to maintain appropriate weight.  Intervention/Goals: Pt and mom were educated and counseled on eating to nourish the body, signs/symptoms of not being adequately nourished, ways to increase nourishment, Rule of 3's, and meal planning. Discussed potentially feeling bloated, gastroparesis, abdominal distention, and feelings of fullness when increasing intake. Pt and mom agreed with  goals listed. Goals: - Breakfast can be 1 cup yogurt + handful of fruit + doughnut.  - Smoothies to include yogurt + multiple fruit options + collagen.  - Meals to include 1/2 plate  of starch/grains + 1/4 plate of protein + 1/4 plate of fruit/vegetable + lipid + dairy/calcium. Examples include: Mashed potatoes made with whole milk and butter + rotisserie chicken + strawberries + yogurt  Meal plan:    3 meals    0-3 snacks  Monitoring and Evaluation: Patient will follow up in 3 weeks.

## 2023-12-07 ENCOUNTER — Encounter: Attending: Family Medicine | Admitting: Registered"

## 2023-12-07 ENCOUNTER — Encounter: Payer: Self-pay | Admitting: Registered"

## 2023-12-07 DIAGNOSIS — F5082 Avoidant/restrictive food intake disorder: Secondary | ICD-10-CM | POA: Diagnosis present

## 2023-12-07 NOTE — Patient Instructions (Addendum)
 Goals: - Breakfast can be 1 cup yogurt + 1 cup of apple juice + doughnut.   - Smoothies to include yogurt + multiple fruit options + collagen.   - Meals to include 1/2 plate of starch/grains + 1/4 plate of protein + 1/4 plate of fruit/vegetable + lipid + dairy/calcium. Examples include: Mashed potatoes made with whole milk and butter + rotisserie chicken + strawberries + yogurt

## 2023-12-07 NOTE — Progress Notes (Signed)
 Appointment start time: 11:01  Appointment end time: 11:43  Patient was seen on 12/07/2023 for nutrition counseling pertaining to disordered eating  Primary care provider: Glendia Fielding, MD Therapist: in process of seeking therapist  ROI: N/A Any other medical team members: none Parents: mom  Horris)   Assessment  Pt arrives with mom. Mom states pt tried baked pasta + pasta sauce with cheese mixed in. Mom states he had about 1 cup of it. States he also tried broiled fish + baked potato at [employer] yesterday and ate all of it. Pt states he felt full afterwards.   States they decreased the amount of yogurt with breakfast from approximately 1.5 cup to 0.5 cup of yogurt + doughnut. States sometimes he has protein shake with it and sometimes he doesn't. States it depends on how he feels. States they made smoothies with fruit and fruit juice only; did not include yogurt and collagen unless he has not had collagen with breakfast. Mom states pt drinks apple juice daily with medicine in the mornings right before or after breakfast. Mom states they are trying to get up earlier in the mornings to have more time for breakfast because sometimes things are rushed. Reports pt tried plain cheeseburger from [employer] and things went well with chewing and swallowing. Pt states since having a cold recently his throat has felt better with swallowing and things are becoming easier.     Growth Metrics: Previous growth data: weight/age  28-75th %; height/age at 75-90th %; BMI/age 43-50th % Goal weight range based on growth chart data: 147+ Goal rate of weight gain:  0.5-1.0 lb/week  Eating history: Length of time: 1 year, since 05/2022 Previous treatments: none Goals for RD meetings: improve dizziness/lightheadedness, headaches, focus/concentration, cold intolerance  Weight history:  Today's weight: 120.7 Highest weight: 149   Lowest weight: 116 Most consistent weight:   What would you like to  weigh: How has weight changed in the past year: weight loss  Medical Information:  Changes in hair, skin, nails since ED started: no Chewing/swallowing difficulties: yes, chews alot until pureed and sometimes he has to force himself; feels like food gets stuck Reflux or heartburn: yes, takes meds as needed Trouble with teeth: no Constipation, diarrhea: no, has BM at least once/day Dizziness/lightheadedness: yes, sporadic a few times a month Headaches/body aches: yes, states it is due to increase in zoloft  Heart racing/chest pain: no Mood: neutral; fatigue Sleep: denies sleep challenges; sleeps 6-7 hrs/night Focus/concentration: sometimes Cold intolerance: sometimes Vision changes: no  Mental health diagnosis: ARFID   Dietary assessment: A typical day consists of 3 meals and 0 snacks  Safe foods include: yogurt, milkshakes, ice cream, floats, mashed potatoes, sweet potatoes, baked potatoes, soups, cheese spread, ritz crackers, smoothies, FairLife protein shakes, protein powder mixed with milkshake, dates, fruit cups, strawberries, cooked apples, doughnuts, applesauce, rotisserie chicken,   Moderate: biscuit, muffin, burger, steak, fish  Avoided foods include: fried foods, pasta, mac and cheese  Used to eat: beans, corn, broccoli, carrots, eggs   24 hour recall:  B (9 am): 1/2 c yogurt + collagen + pumpkin spice doughnut + coffee (collagen) + apple juice (8 oz) S:  L (12-3 pm): broiled fish + baked potato + butter + sweet tea or 1.5 c potato soup (cream cheese, cheese, bone broth) + pretzel bite combos  S:   D (6 pm): frozen 6-8 nuggets + handful of glazed pecans or 2 large chicken dumplings S: FairLife protein shake (26 g PRO)  Beverages:  whole milk, Fair Life chocolate protein shake (26 g PRO), coffee (8 oz), peach tea (16 oz), sweet tea (21 oz), water  (2*16 oz; 32 oz), apple juice (8 oz);  oz  Physical activity: walking 10 min, once/week   What Methods Do You Use To  Control Your Weight (Compensatory behaviors)?           Restricting (calories, fat, carbs)  SIV  Diet pills  Laxatives  Diuretics  Alcohol or drugs  Exercise (what type)  Food rules or rituals (explain)  Binge  Estimated energy intake: 1700-1800 kcal  Estimated energy needs: 2600-2800 kcal 325-350 g CHO 130-140 g pro 87-93 g fat  Nutrition Diagnosis: NB-1.1 Food and nutrition-related knowledge deficit As related to ARFID.  As evidenced by failure to maintain appropriate weight.  Intervention/Goals: Pt and mom were reminded of eating to nourish the body as priority, ways to increase nourishment, and meal planning. Encouraged pt and mom with changes made from previous visit. Pt and mom agreed with goals listed. Goals: - Breakfast can be 1 cup yogurt + 1 cup of apple juice + doughnut.  - Smoothies to include yogurt + multiple fruit options + collagen.  - Meals to include 1/2 plate of starch/grains + 1/4 plate of protein + 1/4 plate of fruit/vegetable + lipid + dairy/calcium. Examples include: Mashed potatoes made with whole milk and butter + rotisserie chicken + strawberries + yogurt   Meal plan:    3 meals    1-3 snacks  Monitoring and Evaluation: Patient will follow up in 2 weeks.

## 2023-12-08 ENCOUNTER — Encounter: Payer: Self-pay | Admitting: Family Medicine

## 2023-12-08 ENCOUNTER — Ambulatory Visit: Admitting: Family Medicine

## 2023-12-08 VITALS — BP 124/77 | HR 64 | Ht 69.0 in | Wt 119.0 lb

## 2023-12-08 DIAGNOSIS — F419 Anxiety disorder, unspecified: Secondary | ICD-10-CM | POA: Diagnosis not present

## 2023-12-08 DIAGNOSIS — F840 Autistic disorder: Secondary | ICD-10-CM

## 2023-12-08 DIAGNOSIS — Z0000 Encounter for general adult medical examination without abnormal findings: Secondary | ICD-10-CM | POA: Diagnosis not present

## 2023-12-08 MED ORDER — SERTRALINE HCL 50 MG PO TABS: 50.0000 mg | ORAL_TABLET | Freq: Every day | ORAL | 1 refills | Status: AC

## 2023-12-08 MED ORDER — PANTOPRAZOLE SODIUM 40 MG PO PACK: 40.0000 mg | PACK | Freq: Every day | ORAL | 5 refills | Status: AC

## 2023-12-08 NOTE — Progress Notes (Signed)
   Subjective:    Patient ID: Jon Adams, male    DOB: 2000-06-24, 23 y.o.   MRN: [id number]  HPI The patient comes in today for a wellness visit. Very nice patient Overall doing fairly well His eating is going better He is taking an better amount of calories He denies any major setbacks with follow back He has autism He lives at home with his parents Not independent Does play banjo as an enjoyment He is disabled   A review of their health history was completed.  A review of medications was also completed.  Any needed refills; no  Eating habits: fair  Falls/  MVA accidents in past few months: no  Regular exercise:   Specialist pt sees on regular basis: nutritionist, neurologist- [facility]  Preventative health issues were discussed.   Additional concerns: none    Review of Systems     Objective:   Physical Exam General-in no acute distress Eyes-no discharge Lungs-respiratory rate normal, CTA CV-no murmurs,RRR Extremities skin warm dry no edema Neuro grossly normal Behavior normal, alert Abdomen is soft no masses Neck no masses       Assessment & Plan:  Patient is relatively thin-part of this is genetic part of this due to anxiety related issues and food selection issues associated with autism and anxiety He does have anxiety issues he is doing well on medicine He also has ARFID doing well with medicines  Adult wellness-complete.wellness physical was conducted today. Importance of diet and exercise were discussed in detail.  Importance of stress reduction and healthy living were discussed.  In addition to this a discussion regarding safety was also covered.  We also reviewed over immunizations and gave recommendations regarding current immunization needed for age.   In addition to this additional areas were also touched on including: Preventative health exams needed:  Colonoscopy not indicated  Patient was advised yearly wellness exam  He has  a follow-up visit with Elveria in a couple months time and he was encouraged to keep that visit

## 2023-12-21 ENCOUNTER — Encounter: Attending: Family Medicine | Admitting: Registered"

## 2023-12-21 ENCOUNTER — Encounter: Payer: Self-pay | Admitting: Registered"

## 2023-12-21 DIAGNOSIS — F5082 Avoidant/restrictive food intake disorder: Secondary | ICD-10-CM | POA: Diagnosis present

## 2023-12-21 NOTE — Patient Instructions (Signed)
-   Aim to 8 oz of fluid with meals.   - Replace FairLife Protein shake with Boost Plus. Have nightly.

## 2023-12-21 NOTE — Progress Notes (Signed)
 Appointment start time: 10:33  Appointment end time: 11:05  Patient was seen on 12/21/2023 for nutrition counseling pertaining to disordered eating  Primary care provider: Glendia Fielding, MD Therapist: in process of seeking therapist  ROI: N/A Any other medical team members: none Parents: mom  Horris)   Assessment  Pt arrives with mom. Pt states things are going well so far. States he had rotisserie chicken + mashed potatoes + coffee + doughnut lately. States they have been trying to increase nourishment. Reports decrease in breakfast as they switched options.    Growth Metrics: Previous growth data: weight/age  59-75th %; height/age at 75-90th %; BMI/age 52-50th % Goal weight range based on growth chart data: 147+ Goal rate of weight gain:  0.5-1.0 lb/week  Eating history: Length of time: 1 year, since 05/2022 Previous treatments: none Goals for RD meetings: improve dizziness/lightheadedness, headaches, focus/concentration, cold intolerance  Weight history:  Today's weight: 118.7  Changes in wt from previous appt: -2 lbs from 120.7 lbs 2 weeks ago (12/07/2023) Highest weight: 149   Lowest weight: 116 Most consistent weight:   What would you like to weigh: How has weight changed in the past year: weight loss  Medical Information:  Changes in hair, skin, nails since ED started: no Chewing/swallowing difficulties: yes, chews alot until pureed and sometimes he has to force himself; feels like food gets stuck Reflux or heartburn: yes, takes meds as needed Trouble with teeth: no Constipation, diarrhea: no, has BM at least once/day Dizziness/lightheadedness: yes, sporadic a few times a month Headaches/body aches: yes, states it is due to increase in zoloft  Heart racing/chest pain: no Mood: neutral; fatigue Sleep: denies sleep challenges; sleeps 6-7 hrs/night Focus/concentration: sometimes Cold intolerance: sometimes Vision changes: no  Mental health diagnosis:  ARFID   Dietary assessment: A typical day consists of 3 meals and 0 snacks  Safe foods include: yogurt, milkshakes, ice cream, floats, mashed potatoes, sweet potatoes, baked potatoes, soups, cheese spread, ritz crackers, smoothies, FairLife protein shakes, protein powder mixed with milkshake, dates, fruit cups, strawberries, cooked apples, doughnuts, applesauce, rotisserie chicken,   Moderate: biscuit, muffin, burger, steak, fish  Avoided foods include: fried foods, pasta, mac and cheese  Used to eat: beans, corn, broccoli, carrots, eggs   24 hour recall:  B (9 am): coffee + 1/2 Fairlife protein shake or 1/2 c yogurt + collagen + pumpkin spice doughnut + coffee (collagen) + apple juice (8 oz) S:  L (12-3 pm): [employer]-1.5 slices of cheese pizza + 2 fudge-stripe cookies or broiled fish + baked potato + butter + sweet tea or 1.5 c potato soup (cream cheese, cheese, bone broth) + pretzel bite combos  S:   D (6 pm): rotisserie chicken in bowl + mashed potatoes + coffee + doughnut or frozen 6-8 nuggets + handful of glazed pecans or 2 large chicken dumplings S: FairLife protein shake (26 g PRO)  Beverages: Fair Life chocolate protein shake (1.5*14 oz; 21 oz; 26 g PRO), coffee (8 oz), peach tea (16 oz), water  (2*16 oz; 32 oz), apple juice (8 oz); 85 oz  Physical activity: walking 10 min, once/week   What Methods Do You Use To Control Your Weight (Compensatory behaviors)?           Restricting (calories, fat, carbs)  SIV  Diet pills  Laxatives  Diuretics  Alcohol or drugs  Exercise (what type)  Food rules or rituals (explain)  Binge  Estimated energy intake: 1600-1800 kcal  Estimated energy needs: 2600-2800 kcal 325-350 g  CHO 130-140 g pro 87-93 g fat  Nutrition Diagnosis: NB-1.1 Food and nutrition-related knowledge deficit As related to ARFID.  As evidenced by failure to maintain appropriate weight.  Intervention/Goals: Pt and mom were reminded of eating to nourish the  body as priority, ways to increase nourishment, and meal planning. Encouraged pt and mom with changes made from previous visit. Pt and mom agreed with goals listed. Goals: - Aim to 8 oz of fluid with meals.  - Replace FairLife Protein shake with Boost Plus. Have nightly.   Meal plan:    3 meals    1-3 snacks  Monitoring and Evaluation: Patient will follow up in 2 weeks.

## 2024-01-05 ENCOUNTER — Encounter: Admitting: Registered"

## 2024-01-19 ENCOUNTER — Encounter: Attending: Family Medicine | Admitting: Registered"

## 2024-01-19 ENCOUNTER — Encounter: Payer: Self-pay | Admitting: Registered"

## 2024-01-19 DIAGNOSIS — F5082 Avoidant/restrictive food intake disorder: Secondary | ICD-10-CM | POA: Diagnosis present

## 2024-01-19 NOTE — Progress Notes (Signed)
 Appointment start time: 11:02  Appointment end time: 11:28  Patient was seen on 01/19/2024 for nutrition counseling pertaining to disordered eating  Primary care provider: Glendia Fielding, MD Therapist: in process of seeking therapist  ROI: N/A Any other medical team members: none Parents: mom  Horris)   Assessment  Pt arrives with mom. Pt states things are going well. Reports replacing FairLife Protein shake with Boost Plus and drinking nightly.  States he has been trying to eat more lately. States he ate 1/2 burrito (tortilla, chicken, beans, salsa) + a few chips +  salsa. States it took him a while to eat but was able to work through it. Reports in the past he has had issues with eating grilled chicken but the other day went well.    Growth Metrics: Previous growth data: weight/age  82-75th %; height/age at 75-90th %; BMI/age 58-50th % Goal weight range based on growth chart data: 147+ Goal rate of weight gain:  0.5-1.0 lb/week  Eating history: Length of time: 1 year, since 05/2022 Previous treatments: none Goals for RD meetings: improve dizziness/lightheadedness, headaches, focus/concentration, cold intolerance  Weight history:  Today's weight: 123.2   Changes in wt from previous appt: +4.5 lbs from 118.7 lbs 4 weeks ago (12/21/2023) Highest weight: 149   Lowest weight: 116 Most consistent weight:   What would you like to weigh: How has weight changed in the past year: weight loss  Medical Information:  Changes in hair, skin, nails since ED started: no Chewing/swallowing difficulties: yes, chews alot until pureed and sometimes he has to force himself; feels like food gets stuck Reflux or heartburn: yes, takes meds as needed Trouble with teeth: no Constipation, diarrhea: no, has BM at least once/day Dizziness/lightheadedness: yes, sporadic a few times a month Headaches/body aches: yes, states it is due to increase in zoloft  Heart racing/chest pain: no Mood: neutral;  fatigue Sleep: denies sleep challenges; sleeps 6-7 hrs/night Focus/concentration: sometimes Cold intolerance: sometimes Vision changes: no  Mental health diagnosis: ARFID   Dietary assessment: A typical day consists of 3 meals and 0 snacks  Safe foods include: yogurt, milkshakes, ice cream, floats, mashed potatoes, sweet potatoes, baked potatoes, soups, cheese spread, ritz crackers, smoothies, FairLife protein shakes, protein powder mixed with milkshake, dates, fruit cups, strawberries, cooked apples, doughnuts, applesauce, rotisserie chicken   Moderate: biscuit, muffin, burger, steak, fish  Avoided foods include: fried foods, pasta, mac and cheese  Used to eat: beans, corn, broccoli, carrots, eggs   24 hour recall:  B (9 am): 1/2 c yogurt + pumpkin spice doughnut + coffee (collagen) + apple juice (8 oz) S:  L (12-3 pm): ham + a little bread + 1 c sweet potato fries + sweet tea   S:   D (6 pm): [employer]-4 nuggets + 1 c mashed potatoes (with butter, whole mik) + mini Coke + leftover tea S: Boost Plus  Beverages: Boost Plus (1*8 oz; 8 oz), coffee (8 oz), sweet tea (16 oz), water  (8 oz), apple juice (8 oz), mini Coke (8 oz); 56 oz  Physical activity: walking 10 min, once/week   What Methods Do You Use To Control Your Weight (Compensatory behaviors)?           Restricting (calories, fat, carbs)  SIV  Diet pills  Laxatives  Diuretics  Alcohol or drugs  Exercise (what type)  Food rules or rituals (explain)  Binge  Estimated energy intake: 1800-1900 kcal  Estimated energy needs: 2600-2800 kcal 325-350 g CHO 130-140 g pro 87-93  g fat  Nutrition Diagnosis: NB-1.1 Food and nutrition-related knowledge deficit As related to ARFID.  As evidenced by failure to maintain appropriate weight.  Intervention/Goals: Pt and mom were encouraged pt and mom with changes made from previous visit. Discussed benefits of routine and adding another Boost Plus to daily regimen. Pt and mom  agreed with goals listed. Goals: - Aim to have Boost Plus daily around 3 pm.  - Keep up the great work with already established goals!  Meal plan:    3 meals    1-3 snacks  Monitoring and Evaluation: Patient will follow up in 3 weeks.

## 2024-01-19 NOTE — Patient Instructions (Signed)
-   Aim to have Boost Plus daily around 3 pm.   - Keep up the great work with already established goals!

## 2024-02-08 LAB — T3, FREE: T3, Free: 3.2 pg/mL (ref 2.0–4.4)

## 2024-02-08 LAB — ALPHA SUBUNIT (FREE): Alpha Subunit (Free): 0.24 ng/mL

## 2024-02-08 LAB — T3, REVERSE: Reverse T3, Serum: 21.6 ng/dL (ref 9.2–24.1)

## 2024-02-08 LAB — TSH: TSH: 3.3 u[IU]/mL (ref 0.450–4.500)

## 2024-02-08 LAB — T4, FREE: Free T4: 1.4 ng/dL (ref 0.82–1.77)

## 2024-02-09 ENCOUNTER — Encounter: Payer: Self-pay | Admitting: Registered"

## 2024-02-09 ENCOUNTER — Encounter: Admitting: Registered"

## 2024-02-09 DIAGNOSIS — F5082 Avoidant/restrictive food intake disorder: Secondary | ICD-10-CM | POA: Diagnosis present

## 2024-02-09 NOTE — Progress Notes (Signed)
 Appointment start time: 10:02  Appointment end time: 10:30  Patient was seen on 02/09/2024 for nutrition counseling pertaining to disordered eating  Primary care provider: Glendia Fielding, MD Therapist: in process of seeking therapist  ROI: N/A Any other medical team members: none Parents: mom  Horris)   Assessment  Pt arrives with mom. States he has had some improvements since previous visit. States he tried pork burrito (green salsa, pico, and melted cheese) for the first time a few weeks ago; ate the whole thing. States he has been eating a few chips with salsa sometimes along with the entree. States he also had sweet tea while there. States he is drinking 2 Boost Plus a day; chocolate. Mom states she has noticed he has been able to do more activity during the day without having a need to sit down. States he often helps her out with household chores.   Mom states pt has endocrinologist appt this week. Reports recent thyroid  looked great and showed improvements from previous visits. States all labs were WNL.    Growth Metrics: Previous growth data: weight/age  50-75th %; height/age at 75-90th %; BMI/age 73-50th % Goal weight range based on growth chart data: 147+ Goal rate of weight gain:  0.5-1.0 lb/week  Eating history: Length of time: 1 year, since 05/2022 Previous treatments: none Goals for RD meetings: improve dizziness/lightheadedness, headaches, focus/concentration, cold intolerance  Weight history:  Today's weight: 125.8    Changes in wt from previous appt: +2.6 lbs from 123.2 lbs 3 weeks ago (01/19/2024) Highest weight: 149   Lowest weight: 116 Most consistent weight:   What would you like to weigh: How has weight changed in the past year: weight loss  Medical Information:  Changes in hair, skin, nails since ED started: no Chewing/swallowing difficulties: yes, chews alot until pureed and sometimes he has to force himself; feels like food gets stuck Reflux or heartburn:  yes, takes meds as needed Trouble with teeth: no Constipation, diarrhea: no, has BM at least once/day Dizziness/lightheadedness: improving, sporadic a few times a month Headaches/body aches: none, has improved Heart racing/chest pain: no Mood: neutral; fatigue is improving Sleep: denies sleep challenges; sleeps 6-7 hrs/night Focus/concentration: sometimes Cold intolerance: sometimes Vision changes: no  Mental health diagnosis: ARFID   Dietary assessment: A typical day consists of 3 meals and 2 snacks  Safe foods include: yogurt, milkshakes, ice cream, floats, mashed potatoes, sweet potatoes, baked potatoes, soups, cheese spread, ritz crackers, smoothies, FairLife protein shakes, protein powder mixed with milkshake, dates, fruit cups, strawberries, cooked apples, doughnuts, applesauce, rotisserie chicken   Moderate: biscuit, muffin, burger, steak, fish  Avoided foods include: fried foods, pasta, mac and cheese  Used to eat: beans, corn, broccoli, carrots, eggs   24 hour recall:  B (9 am): 1/2 c yogurt + pumpkin spice doughnut (ran out this week) + coffee (collagen) + apple juice (8 oz) S:  L (12-3 pm): Mexican restaurant-1/2 pork burrito (pork, pico, cheese) + a few chips and salsa + sweet tea   S:  Boost Plus  D (6 pm): Olympic-mashed potatoes with gravy + 1/2 pinto beans + 1/2 corn bread  S: Boost Plus + chocolate-covered graham cracker  Beverages: Boost Plus (2*8 oz; 16 oz), coffee (8 oz), sweet tea (16 oz), water  (8 oz), apple juice (8 oz), Sprite (21 oz); 77 oz  Physical activity: walking 10 min, once/week   What Methods Do You Use To Control Your Weight (Compensatory behaviors)?  None reported  Estimated energy intake:  2200-2300 kcal  Estimated energy needs: 2600-2800 kcal 325-350 g CHO 130-140 g pro 87-93 g fat  Nutrition Diagnosis: NB-1.1 Food and nutrition-related knowledge deficit As related to ARFID.  As evidenced by failure to maintain appropriate  weight.  Intervention/Goals: Pt and mom were encouraged pt and mom with changes made from previous visit. Discussed benefits of routine and correlation between increased nourishment and improved signs/symptoms. Pt and mom agreed with goals listed. Goals: - Keep up the great work! Continue with already established goals!  Meal plan:    3 meals    1-3 snacks  Monitoring and Evaluation: Patient will follow up in 4 weeks.

## 2024-02-09 NOTE — Patient Instructions (Signed)
-   Keep up the great work! Continue with already established goals!

## 2024-02-11 ENCOUNTER — Ambulatory Visit: Admitting: "Endocrinology

## 2024-02-11 ENCOUNTER — Encounter: Payer: Self-pay | Admitting: "Endocrinology

## 2024-02-11 VITALS — BP 102/74 | HR 68 | Ht 69.0 in | Wt 132.2 lb

## 2024-02-11 DIAGNOSIS — E349 Endocrine disorder, unspecified: Secondary | ICD-10-CM | POA: Diagnosis not present

## 2024-02-11 DIAGNOSIS — R7989 Other specified abnormal findings of blood chemistry: Secondary | ICD-10-CM

## 2024-02-11 NOTE — Progress Notes (Signed)
 02/11/2024, 11:40 AM  Endocrinology follow-up note   Subjective:    Patient ID: Jon Adams, male    DOB: Dec 13, 2000, PCP Alphonsa Glendia DELENA, MD   Past Medical History:  Diagnosis Date   Autism    Development delay    Diarrhea of presumed infectious origin 02/27/2022   Learning disabilities    Past Surgical History:  Procedure Laterality Date   BIOPSY  12/08/2022   Procedure: BIOPSY;  Surgeon: Cindie Carlin POUR, DO;  Location: [facility];  Service: Endoscopy;;   CIRCUMCISION  2003   COLONOSCOPY     DENTAL SURGERY     ESOPHAGOGASTRODUODENOSCOPY (EGD) WITH PROPOFOL  N/A 12/08/2022   Procedure: ESOPHAGOGASTRODUODENOSCOPY (EGD) WITH PROPOFOL ;  Surgeon: Cindie Carlin POUR, DO;  Location: [facility];  Service: Endoscopy;  Laterality: N/A;  945am, asa 2   UPPER GASTROINTESTINAL ENDOSCOPY     WISDOM TOOTH EXTRACTION  2024   Social History   Socioeconomic History   Marital status: Single    Spouse name: Not on file   Number of children: Not on file   Years of education: Not on file   Highest education level: 12th grade  Occupational History   Not on file  Tobacco Use   Smoking status: Never   Smokeless tobacco: Never  Substance and Sexual Activity   Alcohol use: No    Alcohol/week: 0.0 standard drinks of alcohol   Drug use: No   Sexual activity: Never  Other Topics Concern   Not on file  Social History Narrative   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    [name] enjoys journalist, newspaper, old money, computers, and playing on his phone.    He lives with his parents and siblings.   1 dog   Social Drivers of Health   Tobacco Use: Low Risk (02/11/2024)   Patient History    Smoking Tobacco Use: Never    Smokeless Tobacco Use: Never    Passive Exposure: Not on file  Financial Resource Strain: Low Risk (09/16/2023)   Overall Financial Resource Strain (CARDIA)    Difficulty of Paying Living Expenses: Not hard at all  Food  Insecurity: No Food Insecurity (10/26/2023)   Epic    Worried About Radiation Protection Practitioner of Food in the Last Year: Never true    Ran Out of Food in the Last Year: Never true  Transportation Needs: No Transportation Needs (09/16/2023)   Epic    Lack of Transportation (Medical): No    Lack of Transportation (Non-Medical): No  Physical Activity: Inactive (09/16/2023)   Exercise Vital Sign    Days of Exercise per Week: 0 days    Minutes of Exercise per Session: Not on file  Stress: Stress Concern Present (09/16/2023)   [name]    Feeling of Stress: Very much  Social Connections: Socially Isolated (09/16/2023)   Social Connection and Isolation Panel    Frequency of Communication with Friends and Family: Never    Frequency of Social Gatherings with Friends and Family: Once a week    Attends Religious Services: More than 4 times per year    Active Member of [employer] or Organizations: No    Attends [employer]  or Organization Meetings: Not on file    Marital Status: Never married  Depression (PHQ2-9): Low Risk (12/08/2023)   Depression (PHQ2-9)    PHQ-2 Score: 2  Recent Concern: Depression (PHQ2-9) - Medium Risk (09/16/2023)   Depression (PHQ2-9)    PHQ-2 Score: 6  Alcohol Screen: Not on file  Housing: Unknown (09/16/2023)   Epic    Unable to Pay for Housing in the Last Year: No    Number of Times Moved in the Last Year: Not on file    Homeless in the Last Year: No  Utilities: Not on file  Health Literacy: Not on file   Family History  Problem Relation Age of Onset   Asthma Sister    Anxiety disorder Sister    Diabetes Maternal Grandfather    Hypertension Maternal Grandfather    Hyperlipidemia Maternal Grandfather    Diabetes Paternal Grandmother    Hypertension Paternal Grandmother    Hyperlipidemia Paternal Grandmother    Heart attack Paternal Grandfather        Died at 26   Anxiety disorder Mother    Depression Mother     Outpatient Encounter Medications as of 02/11/2024  Medication Sig   cloNIDine  (CATAPRES ) 0.1 MG tablet TAKE (1) TABLET BY MOUTH AT BEDTIME.   ondansetron  (ZOFRAN ) 8 MG tablet Take 1 tablet (8 mg total) by mouth every 8 (eight) hours as needed for nausea or vomiting.   pantoprazole  sodium (PROTONIX ) 40 mg Take 40 mg by mouth daily.   sertraline  (ZOLOFT ) 50 MG tablet Take 1 tablet (50 mg total) by mouth daily.   No facility-administered encounter medications on file as of 02/11/2024.   ALLERGIES: Allergies  Allergen Reactions   Other     Cat dander- eyes watery and swollen; runny nose    VACCINATION STATUS: Immunization History  Administered Date(s) Administered   DTaP 06/10/2001, 08/15/2001, 10/17/2001, 10/09/2002, 04/30/2005   HIB (PRP-OMP) 06/10/2001, 08/15/2001, 10/17/2001, 04/10/2002   Hepatitis A 05/07/2011, 06/07/2012   Hepatitis B September 07, 2001, 06/10/2001, 10/17/2001   IPV 06/10/2001, 08/15/2001, 10/09/2002, 04/30/2005   Influenza Split 12/30/2012   Influenza, Seasonal, Injecte, Preservative Fre 11/15/2015, 11/16/2023   Influenza,inj,Quad PF,6+ Mos 11/30/2013, 11/14/2014, 12/25/2016   Influenza-Unspecified 10/17/2001, 04/10/2002, 01/05/2007, 02/09/2007, 12/14/2007, 02/13/2009, 12/31/2009, 01/08/2012, 12/01/2018, 12/28/2020   MMR 04/10/2002, 04/30/2005   Meningococcal Conjugate 10/25/2012, 05/11/2017   Pneumococcal Conjugate-13 06/10/2001, 08/15/2001   Tdap 10/25/2012   Varicella 04/10/2002, 05/07/2011    HPI Jon Adams is 23 y.o. male who presents today with a medical history as above. he is being seen in follow-up after he was seen in consultation for abnormal thyroid  function tests requested by Alphonsa Glendia DELENA, MD. he was subsequently diagnosed with likely resistance to thyroid  hormone  (RTH), not confirmed by genetics. Patient is accompanied by his mother to clinic.    History is obtained directly from the family as well as his chart review.   -He is not on any  antithyroid intervention.  He has no new complaints today.  His previsit labs were reviewed with him-see below.  Patient  denies any palpitations, tremors, nor heat intolerance.  His previsit thyroid  uptake and scan was unremarkable-see below.      He denies sleep disturbance.  He has no family history of thyroid  dysfunction or thyroid  malignancy.  One of his grandparents might have thyroid  dysfunction.  -He is not currently on antithyroid medications nor thyroid  hormone supplements. -he denies any headaches nor visual field deficits.  He has a documented history of developmental  delay.  Review of Systems  Constitutional: + steady weight gain of 9 pounds since October 2024.      Objective:       02/11/2024   10:01 AM 12/08/2023    2:39 PM 11/16/2023    9:47 AM  Vitals with BMI  Height 5' 9 5' 9 5' 9  Weight 132 lbs 3 oz 119 lbs 121 lbs 6 oz  BMI 19.51 17.57 17.92  Systolic 102 124 884  Diastolic 74 77 72  Pulse 68 64     BP 102/74   Pulse 68   Ht 5' 9 (1.753 m)   Wt 132 lb 3.2 oz (60 kg)   BMI 19.52 kg/m   Wt Readings from Last 3 Encounters:  02/11/24 132 lb 3.2 oz (60 kg)  12/08/23 119 lb (54 kg)  11/16/23 121 lb 6.4 oz (55.1 kg)    Physical Exam  Constitutional:  Body mass index is 19.52 kg/m.,  not in acute distress, normal state of mind Eyes: PERRLA, EOMI, no exophthalmos ENT: moist mucous membranes, no gross thyromegaly, no gross cervical lymphadenopathy   CMP ( most recent) CMP     Component Value Date/Time   NA 143 09/25/2022 1640   K 4.4 09/25/2022 1640   CL 103 09/25/2022 1640   CO2 26 09/25/2022 1640   GLUCOSE 91 09/25/2022 1640   BUN 10 09/25/2022 1640   CREATININE 1.15 09/25/2022 1640   CALCIUM 10.0 09/25/2022 1640   PROT 7.0 09/25/2022 1640   ALBUMIN 5.0 09/25/2022 1640   AST 19 09/25/2022 1640   ALT 21 09/25/2022 1640   ALKPHOS 62 09/25/2022 1640   BILITOT 2.8 (H) 09/25/2022 1640   EGFR 93 09/25/2022 1640   GFRNONAA CANCELED  05/15/2014 1203     Lab Results  Component Value Date   TSH 3.300 02/01/2024   TSH 2.550 07/13/2023   TSH 3.670 02/01/2023   TSH 2.570 10/13/2022   TSH 3.660 09/25/2022   FREET4 1.40 02/01/2024   FREET4 1.75 07/13/2023   FREET4 1.78 (H) 02/01/2023   FREET4 1.82 (H) 10/13/2022   FREET4 1.85 (H) 09/25/2022     Thyroid  uptake and scan findings on November 04, 2022 FINDINGS: 4 hour I-123 uptake = 9.5% (normal 5-20%),  24 hour I-123 uptake = 25.0% (normal 10-30%)   Thyroid  imaging demonstrates homogeneous thyroid  activity bilaterally. No focal hot or cold nodules are identified.   IMPRESSION: Normal thyroid  uptake and scan.   Assessment & Plan:   1.  Abnormal thyroid  labs:   - I have reviewed his  new and available thyroid  records and clinically evaluated the patient. - Based on these reviews, he has slightly better thyroid  profile than previous visits including normal TSH, free T4 and free T3, slightly elevated reverse T3.    his previsit thyroid  uptake and scan is unremarkable-25% uptake in 24 hours with no focal abnormalities. The alpha subunit is not elevated.     This workup so far indicates a rare possibility of resistance to thyroid  hormone  (RTH). -The confirmation for this diagnosis is genetic testing looking for any defective mutation in the subunits of thyroid  hormone receptor.  The family is not interested to seek genetic testing at this time. -Not symptomatic, presents with euthyroid state.  More importantly, there is no need for directed antithyroid intervention in  typical RTH.   -He will continue to need periodic monitoring of thyroid  function, next test would be 6-12 months from now. He has alpha subunit is not  elevated, no need for pituitary imaging at this time.     - he is advised to maintain close follow up with Alphonsa Glendia LABOR, MD for primary care needs.  I spent  20  minutes in the care of the patient today including review of labs from Thyroid   Function, CMP, and other relevant labs ; imaging/biopsy records (current and previous including abstractions from other facilities); face-to-face time discussing  his lab results and symptoms, medications doses, his options of short and long term treatment based on the latest standards of care / guidelines;   and documenting the encounter.  [name]  participated in the discussions, expressed understanding, and voiced agreement with the above plans.  All questions were answered to his satisfaction. he is encouraged to contact clinic should he have any questions or concerns prior to his return visit.   Follow up plan: Return in about 1 year (around 02/10/2025) for F/U with Pre-visit Labs.   Ranny Earl, MD [facility] [address] [place], [place] [address] Phone: [phone]  Fax: [phone]     02/11/2024, 11:40 AM  This note was partially dictated with voice recognition software. Similar sounding words can be transcribed inadequately or may not  be corrected upon review.

## 2024-02-17 ENCOUNTER — Ambulatory Visit: Admitting: Nurse Practitioner

## 2024-02-17 VITALS — BP 109/73 | Ht 69.0 in | Wt 127.8 lb

## 2024-02-17 DIAGNOSIS — F5082 Avoidant/restrictive food intake disorder: Secondary | ICD-10-CM

## 2024-02-17 DIAGNOSIS — K580 Irritable bowel syndrome with diarrhea: Secondary | ICD-10-CM | POA: Diagnosis not present

## 2024-02-17 DIAGNOSIS — K219 Gastro-esophageal reflux disease without esophagitis: Secondary | ICD-10-CM

## 2024-02-17 DIAGNOSIS — F419 Anxiety disorder, unspecified: Secondary | ICD-10-CM | POA: Diagnosis not present

## 2024-02-18 ENCOUNTER — Encounter: Payer: Self-pay | Admitting: Nurse Practitioner

## 2024-02-18 NOTE — Progress Notes (Signed)
 "  Subjective:    Patient ID: Jon Adams, male    DOB: December 17, 2000, 23 y.o.   MRN: [id number]  HPI Discussed the use of AI scribe software for clinical note transcription with the patient, who gave verbal consent to proceed.  History of Present Illness Jon Adams is a 23 year old male who presents for a follow-up visit to monitor weight gain and manage reflux and anxiety. He is accompanied by his mother.  He has shown improvement in his food intake and has gained weight since his last visit in October, confirmed by his mother. He was weighed at another location and is now 132 pounds, up from 123 pounds.  His reflux is well-controlled with daily pantoprazole . No acid reflux, heartburn, nausea, or vomiting. Bowel movements are generally fine, though he occasionally has 'sticky' stools, which he associates with irritable bowel syndrome. No blood in stools or significant changes in color, and no abdominal pain.  He is taking sertraline  for anxiety, which he finds effective. He experiences occasional fluctuations in anxiety levels but feels the medication is working well overall. He is working with a nutritionist, which has been beneficial.     Review of Systems  Respiratory:  Negative for cough, chest tightness and shortness of breath.   Cardiovascular:  Negative for chest pain.  Gastrointestinal:  Negative for abdominal pain, blood in stool, constipation, diarrhea, nausea and vomiting.      02/17/2024    9:15 AM  Depression screen PHQ 2/9  Decreased Interest 0  Down, Depressed, Hopeless 0  PHQ - 2 Score 0  Altered sleeping 0  Tired, decreased energy 0  Change in appetite 2  Feeling bad or failure about yourself  0  Trouble concentrating 0  Moving slowly or fidgety/restless 0  Suicidal thoughts 0  PHQ-9 Score 2  Difficult doing work/chores Somewhat difficult      02/17/2024    9:15 AM 12/08/2023    2:41 PM 10/18/2023    9:25 AM 09/16/2023    3:08 PM  GAD 7 : Generalized  Anxiety Score  Nervous, Anxious, on Edge 0 1 2 3   Control/stop worrying 0 0 1 2  Worry too much - different things 0 0 1   Trouble relaxing 0 0 2 3  Restless 0 0 2 0  Easily annoyed or irritable 0 1 2 3   Afraid - awful might happen 0 0 0 2  Total GAD 7 Score 0 2 10   Anxiety Difficulty Not difficult at all Not difficult at all Somewhat difficult Very difficult    Social History[1]      Objective:   Physical Exam Vitals and nursing note reviewed.  Constitutional:      General: He is not in acute distress. Cardiovascular:     Rate and Rhythm: Normal rate and regular rhythm.     Heart sounds: Normal heart sounds.  Pulmonary:     Effort: Pulmonary effort is normal.     Breath sounds: Normal breath sounds.  Abdominal:     General: There is no distension.     Palpations: Abdomen is soft. There is no mass.     Tenderness: There is no abdominal tenderness. There is no guarding or rebound.  Neurological:     Mental Status: He is alert and oriented to person, place, and time.  Psychiatric:        Mood and Affect: Mood normal.        Behavior: Behavior normal.  Thought Content: Thought content normal.    Today's Vitals   02/17/24 0914  BP: 109/73  Weight: 127 lb 12.8 oz (58 kg)  Height: 5' 9 (1.753 m)   Body mass index is 18.87 kg/m.        Assessment & Plan:  1. Avoidant-restrictive food intake disorder (ARFID) (Primary) Weight gain since October indicates improvement in food intake. Continues to work with a nutritionist, which has been beneficial. - Continue working with nutritionist.  2. Gastroesophageal reflux disease without esophagitis GERD symptoms are well-controlled with pantoprazole . Occasional pressure noted but no pain. - Continue pantoprazole  daily.  3. Irritable bowel syndrome with diarrhea Stable. Continue working with dietician.  4. Anxiety Anxiety is well-controlled with sertraline  50 mg daily. No significant side effects reported. -  Continue sertraline  50 mg daily. - Monitor for need of dose adjustment.  Return in about 6 months (around 08/17/2024).           [1]  Social History Tobacco Use   Smoking status: Never   Smokeless tobacco: Never  Substance Use Topics   Alcohol use: No    Alcohol/week: 0.0 standard drinks of alcohol   Drug use: No   "

## 2024-03-07 ENCOUNTER — Encounter: Admitting: Registered"

## 2024-03-15 ENCOUNTER — Encounter: Payer: Self-pay | Admitting: Registered"

## 2024-03-15 ENCOUNTER — Encounter: Attending: Family Medicine | Admitting: Registered"

## 2024-03-15 DIAGNOSIS — F5082 Avoidant/restrictive food intake disorder: Secondary | ICD-10-CM | POA: Diagnosis present

## 2024-03-15 NOTE — Patient Instructions (Signed)
 Keep up the great work!

## 2024-03-15 NOTE — Progress Notes (Signed)
 Appointment start time: 10:00  Appointment end time: 10:24  Patient was seen on 03/15/2024 for nutrition counseling pertaining to disordered eating  Primary care provider: Glendia Fielding, MD Therapist: in process of seeking therapist  ROI: N/A Any other medical team members: none Parents: mom  Horris)   Assessment  Pt arrives with mom. Mom and pt report that they've noticed pt pulling his ear and rubbing his beard more than before. States this is a sign that he is starting to feel uncomfortable with food and textures and increased concerns of some food getting stuck in the back of his throat. States this has happened in the past and pt would decrease eating. Reports this happened when they were at the beach over the holidays. States when there is change happening around pt, eating challenges will arise. Mom reports multiple changes happening in their home with family moving out of their home, renovations, and pt moving from 1 bedroom to another.    Growth Metrics: Previous growth data: weight/age  12-75th %; height/age at 75-90th %; BMI/age 47-50th % Goal weight range based on growth chart data: 147+ Goal rate of weight gain:  0.5-1.0 lb/week  Eating history: Length of time: 1 year, since 05/2022 Previous treatments: none Goals for RD meetings: improve dizziness/lightheadedness, headaches, focus/concentration, cold intolerance  Weight history:  Today's weight: 128.1   Changes in wt from previous appt: +2.3 lbs from 125.8 lbs 4 weeks ago (02/09/2024) Highest weight: 149   Lowest weight: 116 Most consistent weight:   What would you like to weigh: How has weight changed in the past year: weight loss  Medical Information:  Changes in hair, skin, nails since ED started: no Chewing/swallowing difficulties: yes, chews alot until pureed and sometimes he has to force himself; feels like food gets stuck Reflux or heartburn: yes, takes meds as needed Trouble with teeth: no Constipation,  diarrhea: no, has BM at least once/day Dizziness/lightheadedness: improving, sporadic a few times a month Headaches/body aches: none, has improved Heart racing/chest pain: no Mood: neutral; fatigue is improving Sleep: denies sleep challenges; sleeps 6-7 hrs/night Focus/concentration: sometimes Cold intolerance: sometimes Vision changes: no  Mental health diagnosis: ARFID   Dietary assessment: A typical day consists of 3 meals and 2 snacks  Safe foods include: yogurt, milkshakes, ice cream, floats, mashed potatoes, sweet potatoes, baked potatoes, soups, cheese spread, ritz crackers, smoothies, [employer] protein shakes, protein powder mixed with milkshake, dates, fruit cups, strawberries, cooked apples, doughnuts, applesauce, rotisserie chicken   Moderate: biscuit, muffin, burger, steak, fish  Avoided foods include: fried foods, pasta, mac and cheese  Used to eat: beans, corn, broccoli, carrots, eggs   24 hour recall:  B (9 am): 1/2 c yogurt + pumpkin spice doughnut + coffee (collagen) + apple juice (8 oz) S:  L (12-3 pm): Mexican restaurant-1/2 pork burrito (pork, pico, cheese) + a few chips and salsa + sweet tea   S:  Boost Plus  D (6 pm): [employer]-mashed potatoes with gravy + 1/2 pinto beans + 1/2 corn bread  S: Boost Plus + chocolate-covered graham cracker  Beverages: Boost Plus (2*8 oz; 16 oz), coffee (8 oz), sweet tea (16 oz), water  (8 oz), apple juice (8 oz), Sprite (21 oz); 77 oz  Physical activity: walking 10 min, once/week   What Methods Do You Use To Control Your Weight (Compensatory behaviors)?  None reported  Estimated energy intake: 2200-2300 kcal  Estimated energy needs: 2600-2800 kcal 325-350 g CHO 130-140 g pro 87-93 g fat  Nutrition Diagnosis:  NB-1.1 Food and nutrition-related knowledge deficit As related to ARFID.  As evidenced by failure to maintain appropriate weight.  Intervention/Goals: Pt and mom were encouraged pt and mom with changes made from  previous visit. Discussed slowing pace of eating and stopping when eating to help food feel more comfortable. Pt and mom agreed with goals listed. Goals: - Keep up the great work!   Meal plan:    3 meals    1-3 snacks  Monitoring and Evaluation: Patient will follow up in 4 weeks.

## 2024-04-19 ENCOUNTER — Encounter: Admitting: Registered"

## 2024-08-23 ENCOUNTER — Ambulatory Visit: Admitting: Family Medicine

## 2025-02-12 ENCOUNTER — Ambulatory Visit: Admitting: "Endocrinology
